# Patient Record
Sex: Female | Born: 1955 | Race: White | Hispanic: No | Marital: Married | Smoking: Former smoker
Health system: Southern US, Community
[De-identification: ages and names within clinical notes are randomized; demographics above are authoritative.]

## PROBLEM LIST (undated history)

## (undated) ENCOUNTER — Emergency Department (HOSPITAL_COMMUNITY): Payer: BC Managed Care – PPO

## (undated) DIAGNOSIS — R519 Headache, unspecified: Secondary | ICD-10-CM

## (undated) DIAGNOSIS — M1990 Unspecified osteoarthritis, unspecified site: Secondary | ICD-10-CM

## (undated) DIAGNOSIS — F419 Anxiety disorder, unspecified: Secondary | ICD-10-CM

## (undated) DIAGNOSIS — K219 Gastro-esophageal reflux disease without esophagitis: Secondary | ICD-10-CM

## (undated) DIAGNOSIS — F32A Depression, unspecified: Secondary | ICD-10-CM

## (undated) DIAGNOSIS — I10 Essential (primary) hypertension: Secondary | ICD-10-CM

## (undated) HISTORY — PX: NO PAST SURGERIES: SHX2092

## (undated) HISTORY — PX: WISDOM TOOTH EXTRACTION: SHX21

---

## 2007-03-14 ENCOUNTER — Emergency Department (HOSPITAL_COMMUNITY): Admission: EM | Admit: 2007-03-14 | Discharge: 2007-03-14 | Payer: Self-pay | Admitting: Family Medicine

## 2012-01-26 ENCOUNTER — Other Ambulatory Visit: Payer: Self-pay | Admitting: Gynecology

## 2012-01-26 DIAGNOSIS — Z1231 Encounter for screening mammogram for malignant neoplasm of breast: Secondary | ICD-10-CM

## 2012-03-07 ENCOUNTER — Ambulatory Visit
Admission: RE | Admit: 2012-03-07 | Discharge: 2012-03-07 | Disposition: A | Discharge status: Home / Self Care | Payer: BC Managed Care – PPO | Source: Ambulatory Visit | Attending: Gynecology | Admitting: Gynecology

## 2012-03-07 DIAGNOSIS — Z1231 Encounter for screening mammogram for malignant neoplasm of breast: Secondary | ICD-10-CM

## 2012-08-05 ENCOUNTER — Other Ambulatory Visit: Payer: Self-pay | Admitting: Orthopedic Surgery

## 2012-08-05 DIAGNOSIS — M549 Dorsalgia, unspecified: Secondary | ICD-10-CM

## 2012-08-12 ENCOUNTER — Other Ambulatory Visit: Payer: Self-pay | Admitting: Orthopedic Surgery

## 2012-08-12 ENCOUNTER — Ambulatory Visit
Admission: RE | Admit: 2012-08-12 | Discharge: 2012-08-12 | Disposition: A | Discharge status: Home / Self Care | Payer: BC Managed Care – PPO | Source: Ambulatory Visit | Attending: Orthopedic Surgery | Admitting: Orthopedic Surgery

## 2012-08-12 ENCOUNTER — Ambulatory Visit
Admission: RE | Admit: 2012-08-12 | Discharge: 2012-08-12 | Disposition: A | Discharge status: Home / Self Care | Payer: Self-pay | Source: Ambulatory Visit | Attending: Orthopedic Surgery | Admitting: Orthopedic Surgery

## 2012-08-12 VITALS — BP 166/87 | HR 64 | Ht 61.0 in | Wt 125.0 lb

## 2012-08-12 DIAGNOSIS — M549 Dorsalgia, unspecified: Secondary | ICD-10-CM

## 2012-08-12 MED ORDER — DIAZEPAM 5 MG PO TABS
10.0000 mg | ORAL_TABLET | Freq: Once | ORAL | Status: AC
  Administered 2012-08-12: 10 mg via ORAL

## 2012-08-12 MED ORDER — IOHEXOL 180 MG/ML  SOLN
15.0000 mL | Freq: Once | INTRAMUSCULAR | Status: AC | PRN
  Administered 2012-08-12: 15 mL via INTRATHECAL

## 2012-08-12 NOTE — Progress Notes (Addendum)
Pt has been off prozac, trazodone and adderall since Tuesday. Discharge instructions were explained to pt.

## 2014-07-10 ENCOUNTER — Ambulatory Visit
Admission: RE | Admit: 2014-07-10 | Discharge: 2014-07-10 | Disposition: A | Discharge status: Home / Self Care | Payer: 59 | Source: Ambulatory Visit | Attending: Internal Medicine | Admitting: Internal Medicine

## 2014-07-10 ENCOUNTER — Other Ambulatory Visit: Payer: Self-pay | Admitting: Internal Medicine

## 2014-07-10 DIAGNOSIS — M25552 Pain in left hip: Secondary | ICD-10-CM

## 2014-07-31 ENCOUNTER — Other Ambulatory Visit: Payer: Self-pay | Admitting: Nurse Practitioner

## 2014-07-31 ENCOUNTER — Other Ambulatory Visit (HOSPITAL_COMMUNITY)
Admission: RE | Admit: 2014-07-31 | Discharge: 2014-07-31 | Disposition: A | Discharge status: Home / Self Care | Payer: 59 | Source: Ambulatory Visit | Attending: Nurse Practitioner | Admitting: Nurse Practitioner

## 2014-07-31 DIAGNOSIS — Z1231 Encounter for screening mammogram for malignant neoplasm of breast: Secondary | ICD-10-CM

## 2014-07-31 DIAGNOSIS — Z01419 Encounter for gynecological examination (general) (routine) without abnormal findings: Secondary | ICD-10-CM | POA: Insufficient documentation

## 2014-07-31 DIAGNOSIS — Z1151 Encounter for screening for human papillomavirus (HPV): Secondary | ICD-10-CM | POA: Insufficient documentation

## 2014-08-02 LAB — CYTOLOGY - PAP

## 2014-08-07 ENCOUNTER — Ambulatory Visit
Admission: RE | Admit: 2014-08-07 | Discharge: 2014-08-07 | Disposition: A | Discharge status: Home / Self Care | Payer: 59 | Source: Ambulatory Visit | Attending: Nurse Practitioner | Admitting: Nurse Practitioner

## 2014-08-07 DIAGNOSIS — Z1231 Encounter for screening mammogram for malignant neoplasm of breast: Secondary | ICD-10-CM

## 2015-10-08 DIAGNOSIS — F3342 Major depressive disorder, recurrent, in full remission: Secondary | ICD-10-CM | POA: Diagnosis not present

## 2015-10-08 DIAGNOSIS — F900 Attention-deficit hyperactivity disorder, predominantly inattentive type: Secondary | ICD-10-CM | POA: Diagnosis not present

## 2015-10-15 DIAGNOSIS — M5441 Lumbago with sciatica, right side: Secondary | ICD-10-CM | POA: Diagnosis not present

## 2015-10-15 DIAGNOSIS — Z79891 Long term (current) use of opiate analgesic: Secondary | ICD-10-CM | POA: Diagnosis not present

## 2015-10-15 DIAGNOSIS — M5136 Other intervertebral disc degeneration, lumbar region: Secondary | ICD-10-CM | POA: Diagnosis not present

## 2016-02-10 DIAGNOSIS — H04123 Dry eye syndrome of bilateral lacrimal glands: Secondary | ICD-10-CM | POA: Diagnosis not present

## 2016-02-10 DIAGNOSIS — H40033 Anatomical narrow angle, bilateral: Secondary | ICD-10-CM | POA: Diagnosis not present

## 2016-02-25 DIAGNOSIS — G8929 Other chronic pain: Secondary | ICD-10-CM | POA: Diagnosis not present

## 2016-02-25 DIAGNOSIS — M5136 Other intervertebral disc degeneration, lumbar region: Secondary | ICD-10-CM | POA: Diagnosis not present

## 2016-02-25 DIAGNOSIS — Z79891 Long term (current) use of opiate analgesic: Secondary | ICD-10-CM | POA: Diagnosis not present

## 2016-02-25 DIAGNOSIS — M5441 Lumbago with sciatica, right side: Secondary | ICD-10-CM | POA: Diagnosis not present

## 2016-02-26 DIAGNOSIS — E559 Vitamin D deficiency, unspecified: Secondary | ICD-10-CM | POA: Diagnosis not present

## 2016-02-26 DIAGNOSIS — F17201 Nicotine dependence, unspecified, in remission: Secondary | ICD-10-CM | POA: Diagnosis not present

## 2016-02-26 DIAGNOSIS — Z79899 Other long term (current) drug therapy: Secondary | ICD-10-CM | POA: Diagnosis not present

## 2016-02-26 DIAGNOSIS — Z23 Encounter for immunization: Secondary | ICD-10-CM | POA: Diagnosis not present

## 2016-02-26 DIAGNOSIS — R682 Dry mouth, unspecified: Secondary | ICD-10-CM | POA: Diagnosis not present

## 2016-02-26 DIAGNOSIS — F329 Major depressive disorder, single episode, unspecified: Secondary | ICD-10-CM | POA: Diagnosis not present

## 2016-02-26 DIAGNOSIS — Z0001 Encounter for general adult medical examination with abnormal findings: Secondary | ICD-10-CM | POA: Diagnosis not present

## 2016-03-31 DIAGNOSIS — Z01419 Encounter for gynecological examination (general) (routine) without abnormal findings: Secondary | ICD-10-CM | POA: Diagnosis not present

## 2016-04-06 DIAGNOSIS — F3342 Major depressive disorder, recurrent, in full remission: Secondary | ICD-10-CM | POA: Diagnosis not present

## 2016-04-08 ENCOUNTER — Telehealth: Payer: Self-pay | Admitting: Acute Care

## 2016-04-08 NOTE — Telephone Encounter (Signed)
Linda Osborne ° °

## 2016-05-05 DIAGNOSIS — Z13820 Encounter for screening for osteoporosis: Secondary | ICD-10-CM | POA: Diagnosis not present

## 2016-05-05 DIAGNOSIS — Z780 Asymptomatic menopausal state: Secondary | ICD-10-CM | POA: Diagnosis not present

## 2016-05-11 ENCOUNTER — Telehealth: Payer: Self-pay | Admitting: Acute Care

## 2016-05-12 NOTE — Telephone Encounter (Signed)
error 

## 2016-05-15 ENCOUNTER — Telehealth: Payer: Self-pay | Admitting: Acute Care

## 2016-05-18 NOTE — Telephone Encounter (Signed)
LMTC x 1  

## 2016-05-20 NOTE — Telephone Encounter (Signed)
Will forward to lung cancer screening pool.  

## 2016-05-21 NOTE — Telephone Encounter (Signed)
LMTC x 1  

## 2016-05-22 ENCOUNTER — Telehealth: Payer: Self-pay | Admitting: Acute Care

## 2016-05-25 NOTE — Telephone Encounter (Signed)
Left detailed message for Raynelle FanningJulie that we have tried multiple time to contact pt to schedule SDMV.  Pt had wanted to contact [employer] to check coverage but has not called back to schedule appt.  Will await call back from pt.

## 2016-05-25 NOTE — Telephone Encounter (Signed)
LMTC x `1 for pt 

## 2016-06-01 NOTE — Telephone Encounter (Signed)
Spoke with Raynelle FanningJulie at Dr Kevan NyGates office and informed that due to multiple unsuccessful attempts to contact pt to schedule [facility] LPD[facility] referral will now be cancelled.   New referral will need to be placed if Dr Kevan NyGates would still like pt to be seen.  Letter was also sent to pt.

## 2016-06-01 NOTE — Telephone Encounter (Signed)
Due to multiple unsuccessful attempts to contact pt to schedule SDMV referral will be cancelled.  Spoke with Raynelle FanningJulie at Dr Kevan NyGates office and made her aware that if Dr Kevan NyGates would still like pt to be seen a new referral will need to be placed.  Letter sent to pt to advise her that referral has been cancelled.

## 2016-06-18 DIAGNOSIS — M8588 Other specified disorders of bone density and structure, other site: Secondary | ICD-10-CM | POA: Diagnosis not present

## 2016-06-29 DIAGNOSIS — G8929 Other chronic pain: Secondary | ICD-10-CM | POA: Diagnosis not present

## 2016-06-29 DIAGNOSIS — M5441 Lumbago with sciatica, right side: Secondary | ICD-10-CM | POA: Diagnosis not present

## 2016-08-26 DIAGNOSIS — H52222 Regular astigmatism, left eye: Secondary | ICD-10-CM | POA: Diagnosis not present

## 2016-08-26 DIAGNOSIS — H25013 Cortical age-related cataract, bilateral: Secondary | ICD-10-CM | POA: Diagnosis not present

## 2016-08-26 DIAGNOSIS — H5211 Myopia, right eye: Secondary | ICD-10-CM | POA: Diagnosis not present

## 2016-08-26 DIAGNOSIS — H524 Presbyopia: Secondary | ICD-10-CM | POA: Diagnosis not present

## 2016-08-26 DIAGNOSIS — H2513 Age-related nuclear cataract, bilateral: Secondary | ICD-10-CM | POA: Diagnosis not present

## 2016-09-02 DIAGNOSIS — H25013 Cortical age-related cataract, bilateral: Secondary | ICD-10-CM | POA: Diagnosis not present

## 2016-09-02 DIAGNOSIS — H2513 Age-related nuclear cataract, bilateral: Secondary | ICD-10-CM | POA: Diagnosis not present

## 2016-09-22 DIAGNOSIS — F3342 Major depressive disorder, recurrent, in full remission: Secondary | ICD-10-CM | POA: Diagnosis not present

## 2016-09-22 DIAGNOSIS — F900 Attention-deficit hyperactivity disorder, predominantly inattentive type: Secondary | ICD-10-CM | POA: Diagnosis not present

## 2016-09-23 DIAGNOSIS — H2512 Age-related nuclear cataract, left eye: Secondary | ICD-10-CM | POA: Diagnosis not present

## 2016-09-23 DIAGNOSIS — H25012 Cortical age-related cataract, left eye: Secondary | ICD-10-CM | POA: Diagnosis not present

## 2016-09-23 DIAGNOSIS — H25011 Cortical age-related cataract, right eye: Secondary | ICD-10-CM | POA: Diagnosis not present

## 2016-09-23 DIAGNOSIS — H2511 Age-related nuclear cataract, right eye: Secondary | ICD-10-CM | POA: Diagnosis not present

## 2016-09-30 DIAGNOSIS — H25012 Cortical age-related cataract, left eye: Secondary | ICD-10-CM | POA: Diagnosis not present

## 2016-09-30 DIAGNOSIS — H2512 Age-related nuclear cataract, left eye: Secondary | ICD-10-CM | POA: Diagnosis not present

## 2016-10-27 DIAGNOSIS — M5441 Lumbago with sciatica, right side: Secondary | ICD-10-CM | POA: Diagnosis not present

## 2016-10-27 DIAGNOSIS — G8929 Other chronic pain: Secondary | ICD-10-CM | POA: Diagnosis not present

## 2017-03-22 DIAGNOSIS — F3342 Major depressive disorder, recurrent, in full remission: Secondary | ICD-10-CM | POA: Diagnosis not present

## 2017-03-22 DIAGNOSIS — F900 Attention-deficit hyperactivity disorder, predominantly inattentive type: Secondary | ICD-10-CM | POA: Diagnosis not present

## 2017-03-28 DIAGNOSIS — K0889 Other specified disorders of teeth and supporting structures: Secondary | ICD-10-CM | POA: Diagnosis not present

## 2017-03-28 DIAGNOSIS — R030 Elevated blood-pressure reading, without diagnosis of hypertension: Secondary | ICD-10-CM | POA: Diagnosis not present

## 2017-04-23 DIAGNOSIS — G894 Chronic pain syndrome: Secondary | ICD-10-CM | POA: Diagnosis not present

## 2017-07-21 DIAGNOSIS — G894 Chronic pain syndrome: Secondary | ICD-10-CM | POA: Diagnosis not present

## 2017-07-21 DIAGNOSIS — Z79891 Long term (current) use of opiate analgesic: Secondary | ICD-10-CM | POA: Diagnosis not present

## 2017-07-27 DIAGNOSIS — Z79891 Long term (current) use of opiate analgesic: Secondary | ICD-10-CM | POA: Diagnosis not present

## 2017-09-13 DIAGNOSIS — F3342 Major depressive disorder, recurrent, in full remission: Secondary | ICD-10-CM | POA: Diagnosis not present

## 2017-09-13 DIAGNOSIS — F900 Attention-deficit hyperactivity disorder, predominantly inattentive type: Secondary | ICD-10-CM | POA: Diagnosis not present

## 2017-10-04 ENCOUNTER — Other Ambulatory Visit: Payer: Self-pay | Admitting: Internal Medicine

## 2017-10-04 ENCOUNTER — Ambulatory Visit
Admission: RE | Admit: 2017-10-04 | Discharge: 2017-10-04 | Disposition: A | Discharge status: Home / Self Care | Payer: BLUE CROSS/BLUE SHIELD | Source: Ambulatory Visit | Attending: Internal Medicine | Admitting: Internal Medicine

## 2017-10-04 DIAGNOSIS — F17201 Nicotine dependence, unspecified, in remission: Secondary | ICD-10-CM | POA: Diagnosis not present

## 2017-10-04 DIAGNOSIS — R05 Cough: Secondary | ICD-10-CM

## 2017-10-04 DIAGNOSIS — R5383 Other fatigue: Secondary | ICD-10-CM | POA: Diagnosis not present

## 2017-10-04 DIAGNOSIS — E559 Vitamin D deficiency, unspecified: Secondary | ICD-10-CM | POA: Diagnosis not present

## 2017-10-04 DIAGNOSIS — R059 Cough, unspecified: Secondary | ICD-10-CM

## 2017-10-04 DIAGNOSIS — Z79899 Other long term (current) drug therapy: Secondary | ICD-10-CM | POA: Diagnosis not present

## 2017-10-07 ENCOUNTER — Other Ambulatory Visit: Payer: Self-pay | Admitting: Internal Medicine

## 2017-10-07 DIAGNOSIS — R222 Localized swelling, mass and lump, trunk: Secondary | ICD-10-CM

## 2017-10-11 ENCOUNTER — Ambulatory Visit
Admission: RE | Admit: 2017-10-11 | Discharge: 2017-10-11 | Disposition: A | Discharge status: Home / Self Care | Payer: BLUE CROSS/BLUE SHIELD | Source: Ambulatory Visit | Attending: Internal Medicine | Admitting: Internal Medicine

## 2017-10-11 ENCOUNTER — Encounter: Payer: Self-pay | Admitting: Radiology

## 2017-10-11 DIAGNOSIS — J439 Emphysema, unspecified: Secondary | ICD-10-CM | POA: Diagnosis not present

## 2017-10-11 DIAGNOSIS — R222 Localized swelling, mass and lump, trunk: Secondary | ICD-10-CM

## 2017-10-11 DIAGNOSIS — R918 Other nonspecific abnormal finding of lung field: Secondary | ICD-10-CM | POA: Diagnosis not present

## 2017-10-11 MED ORDER — IOPAMIDOL (ISOVUE-300) INJECTION 61%
75.0000 mL | Freq: Once | INTRAVENOUS | Status: AC | PRN
  Administered 2017-10-11: 75 mL via INTRAVENOUS

## 2017-10-14 ENCOUNTER — Other Ambulatory Visit: Payer: Self-pay | Admitting: Internal Medicine

## 2017-10-14 DIAGNOSIS — R918 Other nonspecific abnormal finding of lung field: Secondary | ICD-10-CM

## 2017-11-16 ENCOUNTER — Other Ambulatory Visit (HOSPITAL_COMMUNITY): Payer: Self-pay | Admitting: Respiratory Therapy

## 2017-11-16 DIAGNOSIS — F17201 Nicotine dependence, unspecified, in remission: Secondary | ICD-10-CM

## 2017-11-16 DIAGNOSIS — Z23 Encounter for immunization: Secondary | ICD-10-CM | POA: Diagnosis not present

## 2017-11-16 DIAGNOSIS — R05 Cough: Secondary | ICD-10-CM | POA: Diagnosis not present

## 2017-11-29 DIAGNOSIS — Z79891 Long term (current) use of opiate analgesic: Secondary | ICD-10-CM | POA: Diagnosis not present

## 2017-12-02 ENCOUNTER — Ambulatory Visit (HOSPITAL_COMMUNITY)
Admission: RE | Admit: 2017-12-02 | Discharge: 2017-12-02 | Disposition: A | Discharge status: Home / Self Care | Payer: BLUE CROSS/BLUE SHIELD | Source: Ambulatory Visit | Attending: Internal Medicine | Admitting: Internal Medicine

## 2017-12-02 DIAGNOSIS — F17201 Nicotine dependence, unspecified, in remission: Secondary | ICD-10-CM | POA: Diagnosis not present

## 2017-12-02 LAB — PULMONARY FUNCTION TEST
DL/VA % pred: 90 %
DL/VA: 3.98 ml/min/mmHg/L
DLCO UNC % PRED: 81 %
DLCO unc: 16.52 ml/min/mmHg
FEF 25-75 PRE: 2.34 L/s
FEF 25-75 Post: 1.31 L/sec
FEF2575-%Change-Post: -44 %
FEF2575-%Pred-Post: 61 %
FEF2575-%Pred-Pre: 109 %
FEV1-%Change-Post: -9 %
FEV1-%PRED-POST: 83 %
FEV1-%Pred-Pre: 92 %
FEV1-PRE: 2.08 L
FEV1-Post: 1.87 L
FEV1FVC-%CHANGE-POST: -15 %
FEV1FVC-%PRED-PRE: 102 %
FEV6-%CHANGE-POST: 15 %
FEV6-%Pred-Post: 94 %
FEV6-%Pred-Pre: 81 %
FEV6-Post: 2.65 L
FEV6-Pre: 2.29 L
FEV6FVC-%Change-Post: -1 %
FEV6FVC-%PRED-PRE: 104 %
FEV6FVC-%Pred-Post: 103 %
FVC-%Change-Post: 6 %
FVC-%Pred-Post: 94 %
FVC-%Pred-Pre: 88 %
FVC-Post: 2.76 L
POST FEV1/FVC RATIO: 68 %
PRE FEV1/FVC RATIO: 80 %
PRE FEV6/FVC RATIO: 100 %
Post FEV6/FVC ratio: 99 %
RV % PRED: 221 %
RV: 4.12 L
TLC % pred: 157 %
TLC: 7.25 L

## 2017-12-02 MED ORDER — ALBUTEROL SULFATE (2.5 MG/3ML) 0.083% IN NEBU
2.5000 mg | INHALATION_SOLUTION | Freq: Once | RESPIRATORY_TRACT | Status: AC
  Administered 2017-12-02: 2.5 mg via RESPIRATORY_TRACT

## 2018-03-14 DIAGNOSIS — F411 Generalized anxiety disorder: Secondary | ICD-10-CM | POA: Diagnosis not present

## 2018-03-14 DIAGNOSIS — F900 Attention-deficit hyperactivity disorder, predominantly inattentive type: Secondary | ICD-10-CM | POA: Diagnosis not present

## 2018-03-14 DIAGNOSIS — F3342 Major depressive disorder, recurrent, in full remission: Secondary | ICD-10-CM | POA: Diagnosis not present

## 2018-03-31 DIAGNOSIS — G894 Chronic pain syndrome: Secondary | ICD-10-CM | POA: Diagnosis not present

## 2018-04-13 ENCOUNTER — Ambulatory Visit
Admission: RE | Admit: 2018-04-13 | Discharge: 2018-04-13 | Disposition: A | Discharge status: Home / Self Care | Payer: BLUE CROSS/BLUE SHIELD | Source: Ambulatory Visit | Attending: Internal Medicine | Admitting: Internal Medicine

## 2018-04-13 DIAGNOSIS — R918 Other nonspecific abnormal finding of lung field: Secondary | ICD-10-CM

## 2018-04-13 MED ORDER — IOPAMIDOL (ISOVUE-300) INJECTION 61%
75.0000 mL | Freq: Once | INTRAVENOUS | Status: AC | PRN
  Administered 2018-04-13: 75 mL via INTRAVENOUS

## 2018-09-08 DIAGNOSIS — Z79899 Other long term (current) drug therapy: Secondary | ICD-10-CM | POA: Diagnosis not present

## 2018-09-12 DIAGNOSIS — F3342 Major depressive disorder, recurrent, in full remission: Secondary | ICD-10-CM | POA: Diagnosis not present

## 2018-09-12 DIAGNOSIS — F410 Panic disorder [episodic paroxysmal anxiety] without agoraphobia: Secondary | ICD-10-CM | POA: Diagnosis not present

## 2018-09-12 DIAGNOSIS — F900 Attention-deficit hyperactivity disorder, predominantly inattentive type: Secondary | ICD-10-CM | POA: Diagnosis not present

## 2018-12-12 DIAGNOSIS — I10 Essential (primary) hypertension: Secondary | ICD-10-CM | POA: Diagnosis not present

## 2018-12-12 DIAGNOSIS — Z23 Encounter for immunization: Secondary | ICD-10-CM | POA: Diagnosis not present

## 2018-12-12 DIAGNOSIS — Z79899 Other long term (current) drug therapy: Secondary | ICD-10-CM | POA: Diagnosis not present

## 2018-12-12 DIAGNOSIS — F329 Major depressive disorder, single episode, unspecified: Secondary | ICD-10-CM | POA: Diagnosis not present

## 2018-12-12 DIAGNOSIS — E559 Vitamin D deficiency, unspecified: Secondary | ICD-10-CM | POA: Diagnosis not present

## 2018-12-12 DIAGNOSIS — R41840 Attention and concentration deficit: Secondary | ICD-10-CM | POA: Diagnosis not present

## 2018-12-12 DIAGNOSIS — R946 Abnormal results of thyroid function studies: Secondary | ICD-10-CM | POA: Diagnosis not present

## 2018-12-12 DIAGNOSIS — R918 Other nonspecific abnormal finding of lung field: Secondary | ICD-10-CM | POA: Diagnosis not present

## 2018-12-12 DIAGNOSIS — F17201 Nicotine dependence, unspecified, in remission: Secondary | ICD-10-CM | POA: Diagnosis not present

## 2018-12-12 DIAGNOSIS — Z0000 Encounter for general adult medical examination without abnormal findings: Secondary | ICD-10-CM | POA: Diagnosis not present

## 2019-01-02 ENCOUNTER — Other Ambulatory Visit (HOSPITAL_COMMUNITY)
Admission: RE | Admit: 2019-01-02 | Discharge: 2019-01-02 | Disposition: A | Discharge status: Home / Self Care | Payer: BC Managed Care – PPO | Source: Ambulatory Visit | Attending: Nurse Practitioner | Admitting: Nurse Practitioner

## 2019-01-02 ENCOUNTER — Other Ambulatory Visit: Payer: Self-pay | Admitting: Nurse Practitioner

## 2019-01-02 DIAGNOSIS — Z124 Encounter for screening for malignant neoplasm of cervix: Secondary | ICD-10-CM | POA: Insufficient documentation

## 2019-01-02 DIAGNOSIS — Z01419 Encounter for gynecological examination (general) (routine) without abnormal findings: Secondary | ICD-10-CM | POA: Diagnosis not present

## 2019-01-04 LAB — CYTOLOGY - PAP
Comment: NEGATIVE
Diagnosis: NEGATIVE
High risk HPV: NEGATIVE

## 2019-01-09 DIAGNOSIS — M545 Low back pain: Secondary | ICD-10-CM | POA: Diagnosis not present

## 2019-01-09 DIAGNOSIS — G894 Chronic pain syndrome: Secondary | ICD-10-CM | POA: Diagnosis not present

## 2019-01-13 DIAGNOSIS — R946 Abnormal results of thyroid function studies: Secondary | ICD-10-CM | POA: Diagnosis not present

## 2019-01-23 ENCOUNTER — Other Ambulatory Visit: Payer: Self-pay | Admitting: Nurse Practitioner

## 2019-01-23 DIAGNOSIS — M8580 Other specified disorders of bone density and structure, unspecified site: Secondary | ICD-10-CM

## 2019-02-20 ENCOUNTER — Other Ambulatory Visit: Payer: Self-pay | Admitting: Internal Medicine

## 2019-02-20 DIAGNOSIS — Z1231 Encounter for screening mammogram for malignant neoplasm of breast: Secondary | ICD-10-CM

## 2019-04-01 DIAGNOSIS — F900 Attention-deficit hyperactivity disorder, predominantly inattentive type: Secondary | ICD-10-CM | POA: Diagnosis not present

## 2019-04-01 DIAGNOSIS — F3342 Major depressive disorder, recurrent, in full remission: Secondary | ICD-10-CM | POA: Diagnosis not present

## 2019-04-17 ENCOUNTER — Ambulatory Visit
Admission: RE | Admit: 2019-04-17 | Discharge: 2019-04-17 | Disposition: A | Discharge status: Home / Self Care | Payer: BC Managed Care – PPO | Source: Ambulatory Visit | Attending: Internal Medicine | Admitting: Internal Medicine

## 2019-04-17 ENCOUNTER — Other Ambulatory Visit: Payer: Self-pay

## 2019-04-17 DIAGNOSIS — Z1231 Encounter for screening mammogram for malignant neoplasm of breast: Secondary | ICD-10-CM | POA: Diagnosis not present

## 2019-05-08 DIAGNOSIS — R52 Pain, unspecified: Secondary | ICD-10-CM | POA: Diagnosis not present

## 2019-05-14 IMAGING — CT CT CHEST W/ CM
1 series · 15 of 34 positions shown, 19 images · IV contrast (iopamidol)
Comparison: Chest x-ray dated October 04, 2017.

CLINICAL DATA: Shortness of breath. Multiple lung nodules on chest
x-ray.

EXAM:
CT CHEST WITH CONTRAST
TECHNIQUE: Multidetector CT imaging of the chest was performed during
intravenous contrast administration.
CONTRAST:  75mL VMDAKP-B77 IOPAMIDOL (VMDAKP-B77) INJECTION 61%

[Series 2: chest w/cm · axial · 0.72mm/px · z∈[-327,-41]mm · 15 of 169 slices shown, 19 images]
[im 13/169  mediastinal]
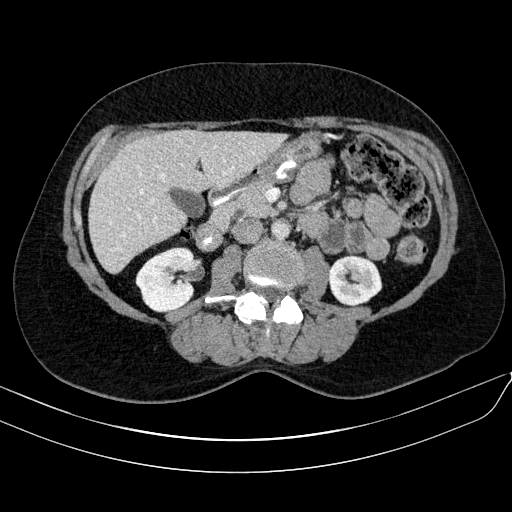
[im 13/169  lung]
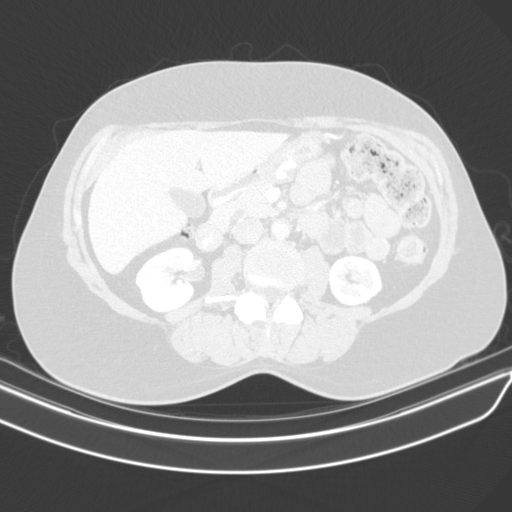
[im 25/169  lung]
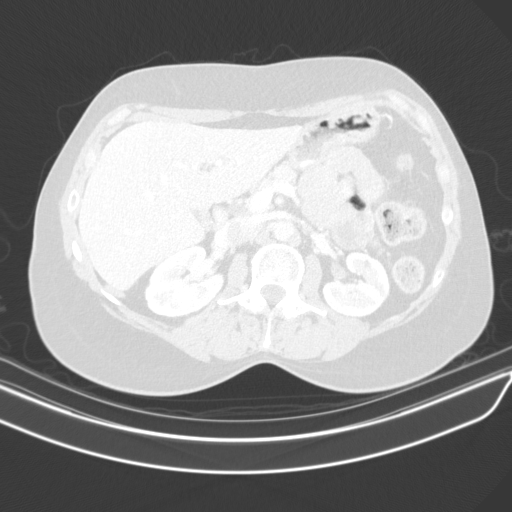
[im 34/169  lung]
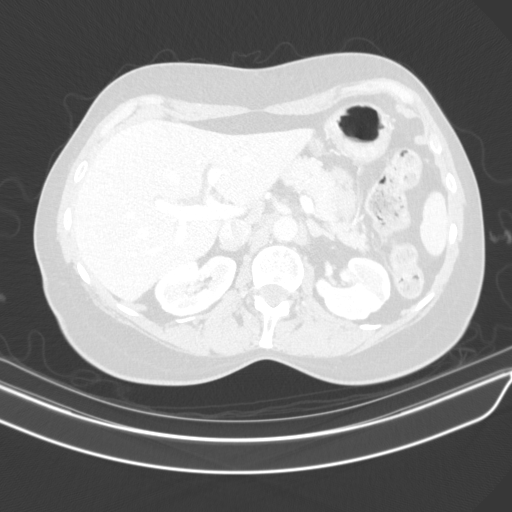
[im 44/169  lung]
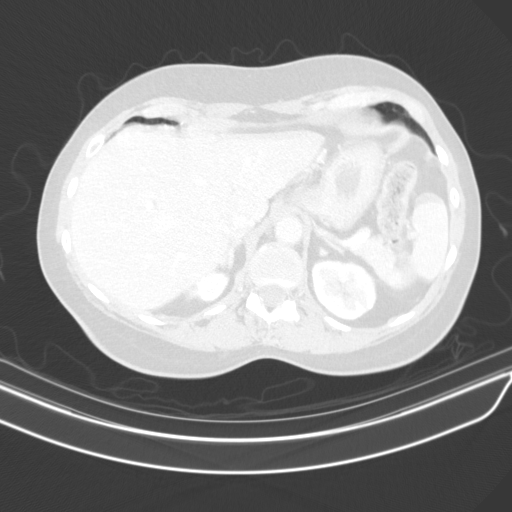
[im 57/169  mediastinal]
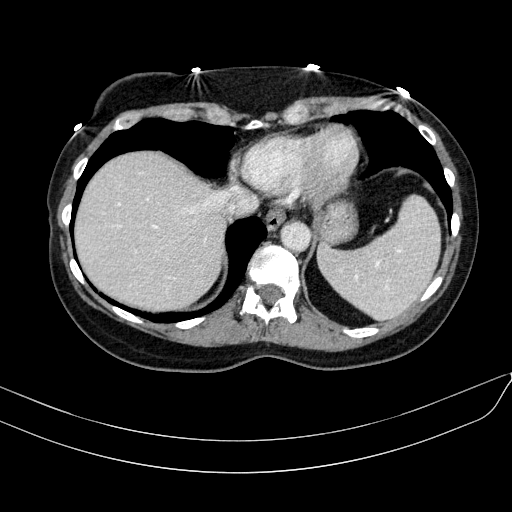
[im 57/169  lung]
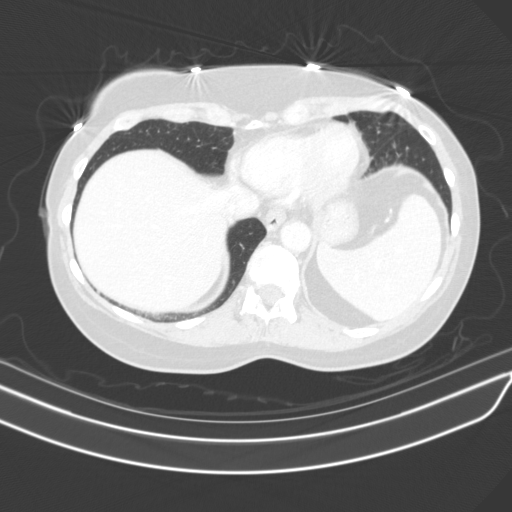
[im 68/169  lung]
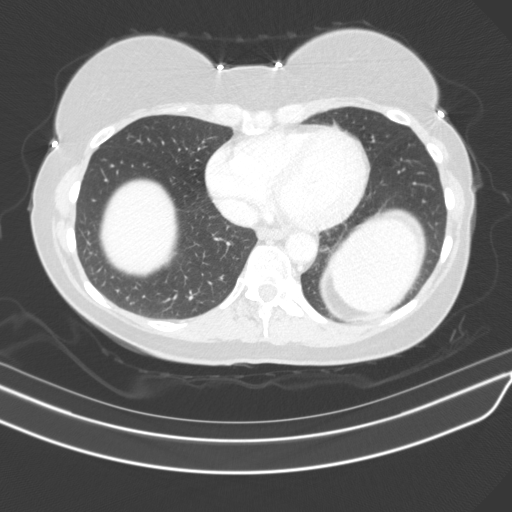
[im 75/169  lung]
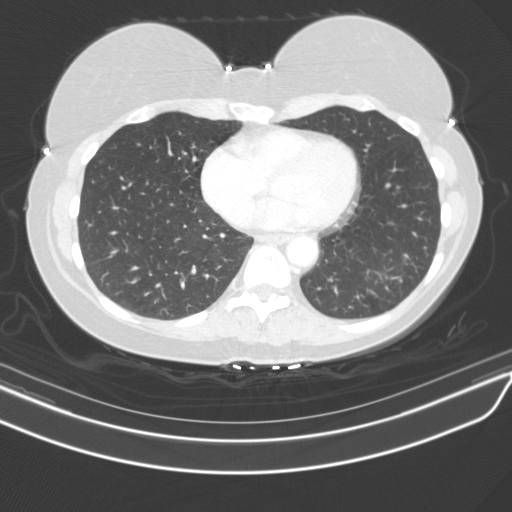
[im 88/169  lung]
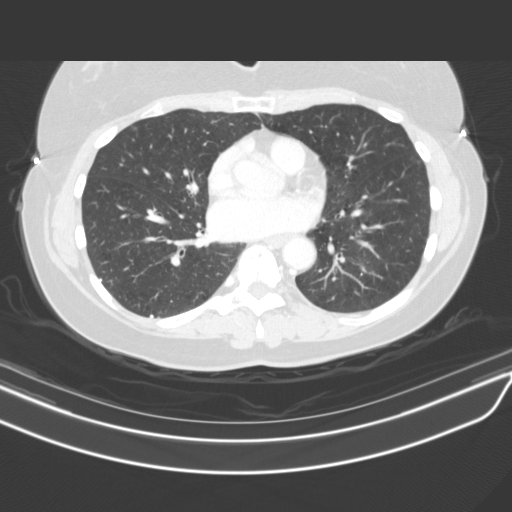
[im 94/169  mediastinal]
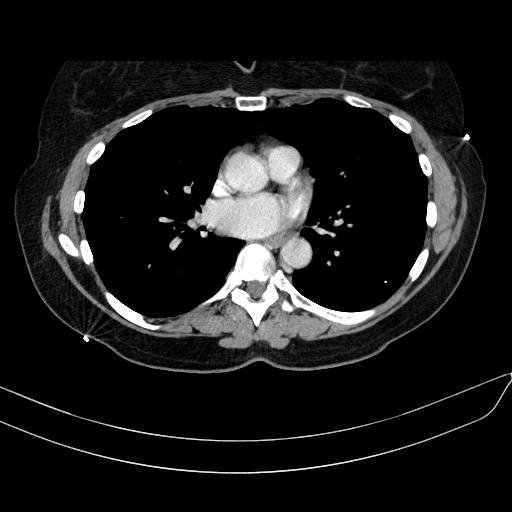
[im 94/169  lung]
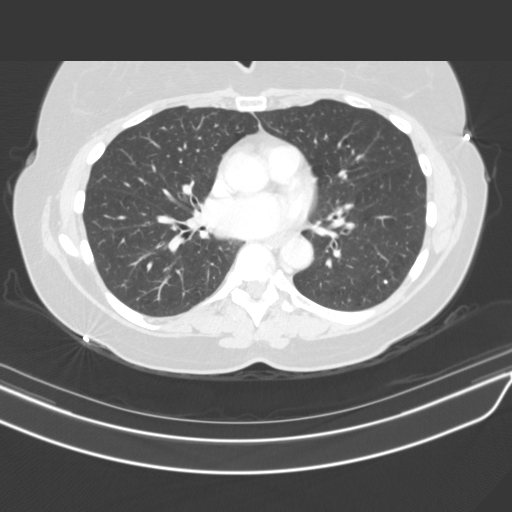
[im 101/169  lung]
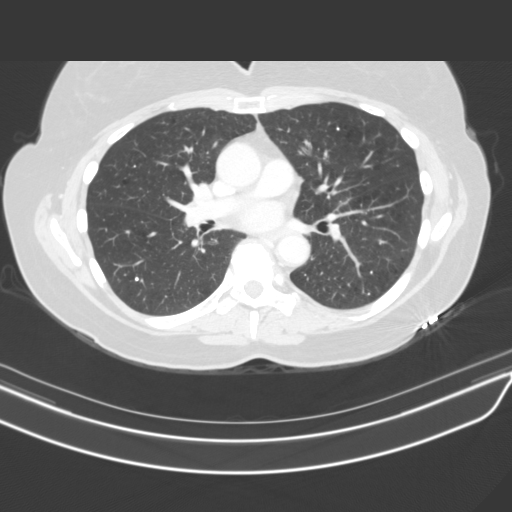
[im 113/169  lung]
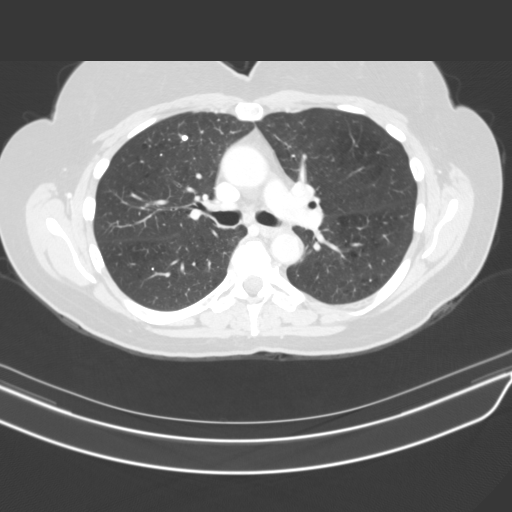
[im 125/169  lung]
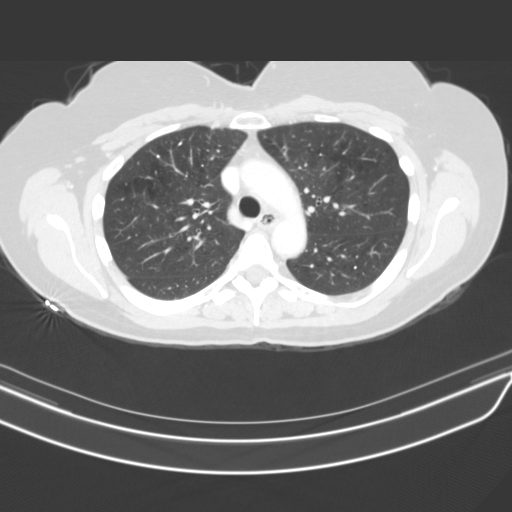
[im 135/169  mediastinal]
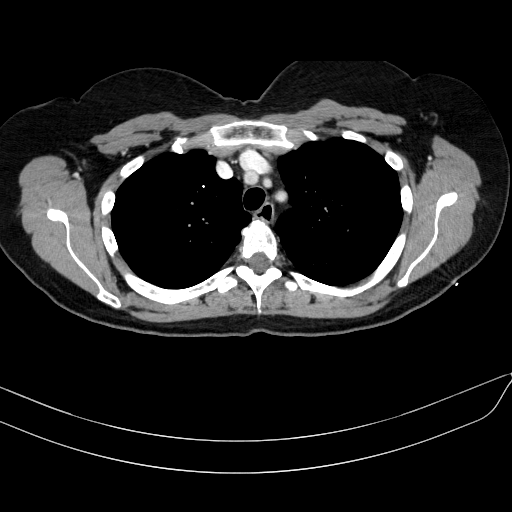
[im 135/169  lung]
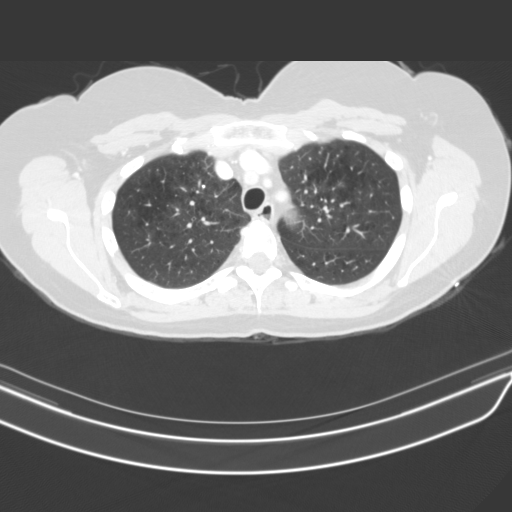
[im 144/169  lung]
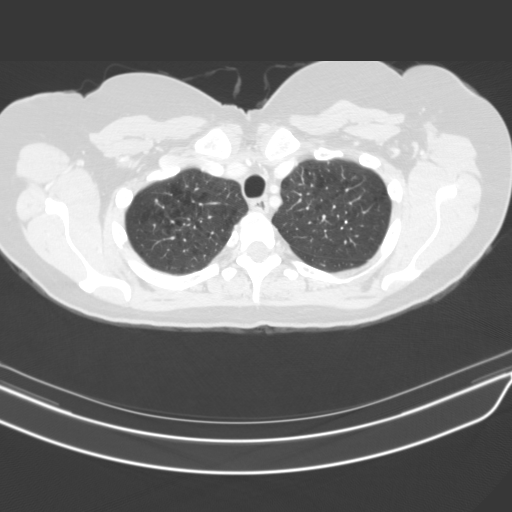
[im 156/169  lung]
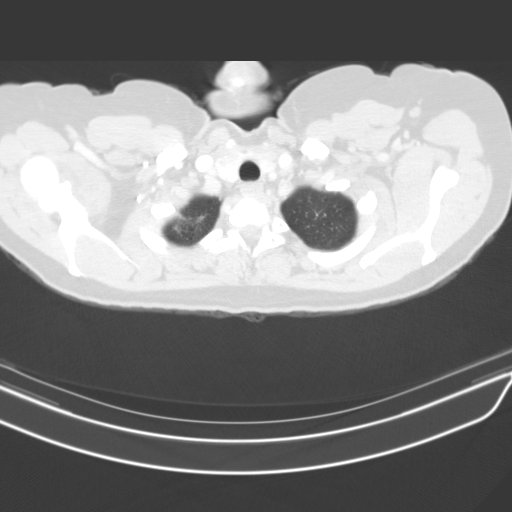

[15 of 34 positions shown; findings below may reference images not displayed]

FINDINGS: Cardiovascular: Normal heart size. No pericardial effusion. Normal
caliber thoracic aorta. Mild coronary, aortic arch, and branch
vessel atherosclerotic vascular disease. No central pulmonary
embolism.

Mediastinum/Nodes: No enlarged mediastinal, hilar, or axillary lymph
nodes. Multiple small hypodense nodules in both thyroid lobes
measuring up to 1 cm. The trachea and esophagus demonstrate no
significant findings.

Lungs/Pleura: There are multiple small diffusely calcified nodules
throughout both lungs. 7 x 4 mm pulmonary nodule in the right lung
apex (series 3, image 17). There is a partially rim calcified 6 x 5
mm pulmonary nodule in the left lower lobe (series 3, image 51).
Mild centrilobular emphysema. No focal consolidation, pleural
effusion, or pneumothorax.

Upper Abdomen: No acute abnormality.

Musculoskeletal: No chest wall abnormality. No acute or significant
osseous findings. Moderate degenerative changes of the midthoracic
spine.
IMPRESSION: 1. 6 mm aggregate pulmonary nodules in the right upper and left
lower lobes. Non-contrast chest CT at 3-6 months is recommended. If
the nodules are stable at time of repeat CT, then future CT at 18-24
months (from today's scan) is considered optional for low-risk
patients, but is recommended for high-risk patients. This
recommendation follows the consensus statement: Guidelines for
Management of Incidental Pulmonary Nodules Detected on CT Images:
2. Multiple small diffusely calcified nodules throughout both lungs,
consistent with prior granulomatous disease.
3.  Emphysema (9TTK0-OAD.Q).
4.  Aortic atherosclerosis (9TTK0-WIZ.Z).

## 2019-08-21 ENCOUNTER — Emergency Department (HOSPITAL_COMMUNITY)
Admission: EM | Admit: 2019-08-21 | Discharge: 2019-08-22 | Disposition: A | Discharge status: Home / Self Care | Payer: BC Managed Care – PPO | Attending: Emergency Medicine | Admitting: Emergency Medicine

## 2019-08-21 ENCOUNTER — Other Ambulatory Visit: Payer: Self-pay

## 2019-08-21 ENCOUNTER — Encounter (HOSPITAL_COMMUNITY): Payer: Self-pay | Admitting: Emergency Medicine

## 2019-08-21 ENCOUNTER — Ambulatory Visit
Admission: EM | Admit: 2019-08-21 | Discharge: 2019-08-21 | Disposition: A | Discharge status: Home / Self Care | Payer: BC Managed Care – PPO | Source: Home / Self Care

## 2019-08-21 DIAGNOSIS — R55 Syncope and collapse: Secondary | ICD-10-CM | POA: Diagnosis not present

## 2019-08-21 DIAGNOSIS — Z5321 Procedure and treatment not carried out due to patient leaving prior to being seen by health care provider: Secondary | ICD-10-CM | POA: Diagnosis not present

## 2019-08-21 LAB — URINALYSIS, ROUTINE W REFLEX MICROSCOPIC
Bilirubin Urine: NEGATIVE
Glucose, UA: NEGATIVE mg/dL
Hgb urine dipstick: NEGATIVE
Ketones, ur: NEGATIVE mg/dL
Nitrite: NEGATIVE
Protein, ur: NEGATIVE mg/dL
Specific Gravity, Urine: 1.019 (ref 1.005–1.030)
pH: 6 (ref 5.0–8.0)

## 2019-08-21 LAB — CBC
HCT: 39.6 % (ref 36.0–46.0)
Hemoglobin: 12.9 g/dL (ref 12.0–15.0)
MCH: 30.1 pg (ref 26.0–34.0)
MCHC: 32.6 g/dL (ref 30.0–36.0)
MCV: 92.5 fL (ref 80.0–100.0)
Platelets: 365 10*3/uL (ref 150–400)
RBC: 4.28 MIL/uL (ref 3.87–5.11)
RDW: 13.2 % (ref 11.5–15.5)
WBC: 10 10*3/uL (ref 4.0–10.5)
nRBC: 0 % (ref 0.0–0.2)

## 2019-08-21 LAB — BASIC METABOLIC PANEL
Anion gap: 7 (ref 5–15)
BUN: 12 mg/dL (ref 8–23)
CO2: 27 mmol/L (ref 22–32)
Calcium: 10.5 mg/dL — ABNORMAL HIGH (ref 8.9–10.3)
Chloride: 106 mmol/L (ref 98–111)
Creatinine, Ser: 0.84 mg/dL (ref 0.44–1.00)
GFR calc Af Amer: >60 mL/min (ref >60)
GFR calc non Af Amer: >60 mL/min (ref >60)
Glucose, Bld: 102 mg/dL — ABNORMAL HIGH (ref 70–99)
Potassium: 4 mmol/L (ref 3.5–5.1)
Sodium: 140 mmol/L (ref 135–145)

## 2019-08-21 MED ORDER — SODIUM CHLORIDE 0.9% FLUSH: 3.0000 mL | Freq: Once | INTRAVENOUS | Status: DC

## 2019-08-21 NOTE — ED Notes (Signed)
Patient is being discharged from the Urgent Care and sent to the Emergency Department via POV with husband as driver . Per [place] [facility], [place], patient is in need of higher level of care due to syncopal episode. Patient is aware and verbalizes understanding of plan of care.  Vitals:   08/21/19 1959  BP: (!) 151/89  Pulse: 83  Resp: 18  Temp: 98 F (36.7 C)  SpO2: 98%

## 2019-08-21 NOTE — ED Triage Notes (Signed)
Pt sent by UC for further evaluation of syncopal episode. Pt reports she was in the kitchen cooking, and then her husband found her on the floor. Pt does not recall how this happened, states she has been feeling her normal today. A&O x 4 at this time.

## 2019-08-21 NOTE — ED Triage Notes (Signed)
Pt presents to UC for possible syncopal episode. Pt husband states he found patient laying in floor at home, sitting against the kitchen cabinet. Pt has no memory about how she arrived to that position. Pt AAO at this time, but cannot recall even whatsoever. Pt instructed to go to ER with husband driving, verbal understanding of both.

## 2019-08-22 DIAGNOSIS — R55 Syncope and collapse: Secondary | ICD-10-CM | POA: Diagnosis not present

## 2019-08-30 ENCOUNTER — Encounter: Payer: Self-pay | Admitting: *Deleted

## 2019-08-30 ENCOUNTER — Ambulatory Visit: Payer: BC Managed Care – PPO | Admitting: Neurology

## 2019-08-30 ENCOUNTER — Encounter: Payer: Self-pay | Admitting: Neurology

## 2019-08-30 VITALS — BP 187/100 | HR 79 | Ht 61.0 in | Wt 134.0 lb

## 2019-08-30 DIAGNOSIS — R55 Syncope and collapse: Secondary | ICD-10-CM | POA: Insufficient documentation

## 2019-08-30 DIAGNOSIS — R4020 Unspecified coma: Secondary | ICD-10-CM | POA: Diagnosis not present

## 2019-08-30 NOTE — Progress Notes (Signed)
[facility]  PATIENT: Linda Osborne DOB: 63-May-1957  REFERRING DOCTOR OR PCP: Marden Noble, MD SOURCE: Patient, notes from PCP, emergency room records, EKG and lab  _________________________________   HISTORICAL  CHIEF COMPLAINT:  Chief Complaint  Patient presents with  . New Patient (Initial Visit)    RM 12 with husband. Paper referral from [facility], [place] for syncope/collapse. She was cooking eggs in the kitchen and felt light headed and slid herself down the cabinet and does not remember anything after. Husband found he sitting against the cabinet unconcious. He had to wake her up and she was very groggy, unsure what was going on. EKG/labs negative. Has had no recent CT/MRI brain.     HISTORY OF PRESENT ILLNESS:  I had the pleasure seeing your patient, Linda Osborne, at [facility] for neurologic consultation regarding her recent episode of loss of consciousness.  On 08/21/19, she was making hard boiled eggs and recalls looking down when she dropped an egg.   Next thing she recalls is v\being on the floor.   Her husband returned (could have been as long as 4-5 hours later if the event was at lunch) and she was half sitting against the [place] in the kitchen when he enterred.   She was initially asleep and then groggy. She needed help to get tot he bed.  She rested a few hours but was still very tired and confused so they want to the ED.  After several hours she had not been seen and they left.   She saw primary care a day or two later and was referred for further evaluation.      She has never had a similar event in the past.    There was no evidence of head trauma (blood, knot,laceration).     She remained groggy the day of the event but was better later that night.     Right now she is slightly tired but essentially back to baseline.      She is on oxycodone for chronic back pain and Adderall for ADD .  She is also on Wellbutrin and  fluoxetine for mood,  EKG and labs were normal or noncontributary.      REVIEW OF SYSTEMS: Constitutional: No fevers, chills, sweats, or change in appetite.  She has ADD. Eyes: No visual changes, double vision, eye pain Ear, nose and throat: No hearing loss, ear pain, nasal congestion, sore throat Cardiovascular: No chest pain, palpitations Respiratory: No shortness of breath at rest or with exertion.   No wheezes GastrointestinaI: No nausea, vomiting, diarrhea, abdominal pain, fecal incontinence Genitourinary: No dysuria, urinary retention or frequency.  No nocturia. Musculoskeletal:She has chronic back pain. Integumentary: No rash, pruritus, skin lesions Neurological: as above Psychiatric: She has had depression and anxiety. Endocrine: No palpitations, diaphoresis, change in appetite, change in weigh or increased thirst Hematologic/Lymphatic: No anemia, purpura, petechiae. Allergic/Immunologic: No itchy/runny eyes, nasal congestion, recent allergic reactions, rashes  ALLERGIES: No Known Allergies  HOME MEDICATIONS:  Current Outpatient Medications:  .  amphetamine-dextroamphetamine (ADDERALL) 20 MG tablet, Take 20 mg by mouth daily., Disp: , Rfl:  .  amphetamine-dextroamphetamine (ADDERALL) 30 MG tablet, Take 1 tablet by mouth. 1 am, 1 at noon, Disp: , Rfl:  .  buPROPion (WELLBUTRIN XL) 300 MG 24 hr tablet, Take 300 mg by mouth every morning., Disp: , Rfl:  .  FLUoxetine (PROZAC) 20 MG capsule, Take 60 mg by mouth daily., Disp: , Rfl:  .  Oxycodone  HCl 10 MG TABS, Take 10 mg by mouth 5 (five) times daily as needed., Disp: , Rfl:  .  traZODone (DESYREL) 50 MG tablet, Take 1-3 tablets by mouth at bedtime., Disp: , Rfl:   PAST MEDICAL HISTORY: History reviewed. No pertinent past medical history.  PAST SURGICAL HISTORY: Past Surgical History:  Procedure Laterality Date  . NO PAST SURGERIES      FAMILY HISTORY: Family History  Problem Relation Age of Onset  . Breast  cancer Neg Hx     SOCIAL HISTORY:  Social History   Socioeconomic History  . Marital status: Married    Spouse name: Not on file  . Number of children: Not on file  . Years of education: Not on file  . Highest education level: Not on file  Occupational History  . Not on file  Tobacco Use  . Smoking status: Former Smoker    Packs/day: 0.50    Years: 20.00    Pack years: 10.00    Types: Cigarettes    Quit date: 08/13/2010    Years since quitting: 9.0  . Smokeless tobacco: Never Used  Substance and Sexual Activity  . Alcohol use: Not Currently  . Drug use: Never  . Sexual activity: Not on file  Other Topics Concern  . Not on file  Social History Narrative   Right handed    Lives with husband   Caffeine use: 1 cup coffee per day   Sweet tea sometimes   Social Determinants of Health   Financial Resource Strain:   . Difficulty of Paying Living Expenses:   Food Insecurity:   . Worried About Charity fundraiser in the Last Year:   . Arboriculturist in the Last Year:   Transportation Needs:   . Film/video editor (Medical):   Marland Kitchen Lack of Transportation (Non-Medical):   Physical Activity:   . Days of Exercise per Week:   . Minutes of Exercise per Session:   Stress:   . Feeling of Stress :   Social Connections:   . Frequency of Communication with Friends and Family:   . Frequency of Social Gatherings with Friends and Family:   . Attends Religious Services:   . Active Member of Clubs or Organizations:   . Attends Archivist Meetings:   Marland Kitchen Marital Status:   Intimate Partner Violence:   . Fear of Current or Ex-Partner:   . Emotionally Abused:   Marland Kitchen Physically Abused:   . Sexually Abused:      PHYSICAL EXAM  Vitals:   08/30/19 1433  BP: (!) 187/100  Pulse: 79  Weight: 134 lb (60.8 kg)  Height: 5\' 1"  (1.549 m)    Body mass index is 25.32 kg/m.   General: The patient is well-developed and well-nourished and in no acute distress  HEENT:  Head is  [place]/AT.  Sclera are anicteric.  Funduscopic exam shows normal optic discs and retinal vessels.   Head is slightly tender at temples.    Neck: No carotid bruits are noted.  The neck is nontender.  Cardiovascular: The heart has a regular rate and rhythm with a normal S1 and S2. There were no murmurs, gallops or rubs.    Skin: Extremities are without rash or edema.   Neurologic Exam  Mental status: The patient is alert and oriented x 3 at the time of the examination. The patient has apparent normal recent and remote memory, with an apparently normal attention span and concentration ability.  Speech is normal.  Cranial nerves: Extraocular movements are full. Pupils are equal, round, and reactive to light and accomodation.  Visual fields are full.  Facial symmetry is present. There is good facial sensation to soft touch bilaterally.Facial strength is normal.  Trapezius and sternocleidomastoid strength is normal. No dysarthria is noted.  The tongue is midline, and the patient has symmetric elevation of the soft palate. No obvious hearing deficits are noted.  Motor:  Muscle bulk is normal.   Tone is normal. Strength is  5 / 5 in all 4 extremities.   Sensory: Sensory testing is intact to pinprick, soft touch and vibration sensation in all 4 extremities except minimal reduced vibration in foot  Coordination: Cerebellar testing reveals good finger-nose-finger and heel-to-shin bilaterally.  Gait and station: Station is normal.   Gait is normal. Tandem gait is mildly wide. Romberg is negative.   Reflexes: Deep tendon reflexes are symmetric and normal in arms, 3 at the knees and 2 at the ankles.  There are no pathologic reflexes..   Plantar responses are flexor.    DIAGNOSTIC DATA (LABS, IMAGING, TESTING) - I reviewed patient records, labs, notes, testing and imaging myself where available.  Lab Results  Component Value Date   WBC 10.0 08/21/2019   HGB 12.9 08/21/2019   HCT 39.6 08/21/2019   MCV  92.5 08/21/2019   PLT 365 08/21/2019      Component Value Date/Time   NA 140 08/21/2019 2100   K 4.0 08/21/2019 2100   CL 106 08/21/2019 2100   CO2 27 08/21/2019 2100   GLUCOSE 102 (H) 08/21/2019 2100   BUN 12 08/21/2019 2100   CREATININE 0.84 08/21/2019 2100   CALCIUM 10.5 (H) 08/21/2019 2100   GFRNONAA >60 08/21/2019 2100   GFRAA >60 08/21/2019 2100       ASSESSMENT AND PLAN  Syncope and collapse - Plan: MR BRAIN W WO CONTRAST, EEG  Loss of consciousness (HCC) - Plan: MR BRAIN W WO CONTRAST, EEG   In summary, Ms. Judon is a 63 year old woman who had an episode of loss of consciousness 08/21/2019.  The sequence of events is unclear.  It is possible that she had a seizure and was found groggy by the husband when he returned.  She also could have either tripped or had syncope and then hit her head causing the grogginess.  It is unlikely that this was a straightforward vasovagal or cardiogenic syncope as she took hours to recover to baseline.  Regardless, we need to check several tests including an EEG and an MRI of the brain.  If the EEG is abnormal or if the MRI shows focus that could contribute to lowering the seizure threshold, I will start an antiepileptic agent.  Additionally if this occurs we will need to stop the Wellbutrin.  If normal, and another event occurs we will also need to reconsider initiating an antiepileptic drug.  They will return to see me if there are new or worsening neurologic symptoms and we will call with the results of the test.  Thank you for asking me to see Ms. Lucille Passy.  Please let me know if I can be of further assistance with her or other patients in the future.   Hilario Robarts A. Epimenio Foot, MD, [facility] 08/30/2019, 3:03 PM Certified in Neurology, Clinical Neurophysiology, Sleep Medicine and Neuroimaging  [facility] [address], [address] [place], [place] [address] [phone]

## 2019-09-04 ENCOUNTER — Ambulatory Visit: Payer: BC Managed Care – PPO | Admitting: Neurology

## 2019-09-04 ENCOUNTER — Telehealth: Payer: Self-pay | Admitting: Neurology

## 2019-09-04 DIAGNOSIS — R55 Syncope and collapse: Secondary | ICD-10-CM

## 2019-09-04 DIAGNOSIS — R4020 Unspecified coma: Secondary | ICD-10-CM

## 2019-09-04 NOTE — Telephone Encounter (Signed)
Spoke to patient husband who is on the dpr schedule for 09/05/19 at [facility].

## 2019-09-04 NOTE — Telephone Encounter (Signed)
LVM for pt to call back about scheduling mri  [employer] auth: [id number] (exp. 09/01/19 to 02/27/20)

## 2019-09-05 ENCOUNTER — Ambulatory Visit: Payer: BC Managed Care – PPO

## 2019-09-05 ENCOUNTER — Other Ambulatory Visit: Payer: Self-pay

## 2019-09-05 DIAGNOSIS — R4020 Unspecified coma: Secondary | ICD-10-CM | POA: Diagnosis not present

## 2019-09-05 DIAGNOSIS — R55 Syncope and collapse: Secondary | ICD-10-CM

## 2019-09-05 MED ORDER — GADOBENATE DIMEGLUMINE 529 MG/ML IV SOLN
11.0000 mL | Freq: Once | INTRAVENOUS | Status: AC | PRN
Start: 2019-09-05 — End: 2019-09-05
  Administered 2019-09-05: 11 mL via INTRAVENOUS

## 2019-09-18 NOTE — Progress Notes (Signed)
   [facility]  EEG (ELECTROENCEPHALOGRAM) REPORT   STUDY DATE: 09/04/2019 PATIENT NAME: Linda Osborne DOB: 10/05/1955 MRN: [id number]  ORDERING CLINICIAN: Michel Eskelson A. Epimenio Foot, MD. PhD  TECHNOLOGIST: Elvis Coil, RPSGT TECHNIQUE: Electroencephalogram was recorded utilizing standard 10-20 system of lead placement and reformatted into average and bipolar montages.  RECORDING TIME: 25 minutes 18 seconds  CLINICAL INFORMATION: 64 year old woman with episode of loss of consciousness  FINDINGS: A digital EEG was performed while the patient was awake and drowsy. While awake and most alert there was a 11 hz posterior dominant rhythm. Voltages and frequencies were symmetric.  There were no focal, lateralizing, epileptiform activity or seizures seen.  Photic stimulation had a normal driving response. Hyperventilation and recovery did not change the underlying rhythms. EKG channel shows normal sinus rhythm.  She became drowsy but did not enter definitive sleep.Marland Kitchen  IMPRESSION: This is a normal EEG while the patient was awake and drowsy.   INTERPRETING PHYSICIAN:   Tuleen Mandelbaum A. Epimenio Foot, MD, PhD, [facility] Certified in Neurology, Clinical Neurophysiology, Sleep Medicine, Pain Medicine and Neuroimaging  [facility] [address] [place], [place] [address] [phone]

## 2019-09-19 ENCOUNTER — Encounter: Payer: Self-pay | Admitting: *Deleted

## 2019-09-26 DIAGNOSIS — Z79891 Long term (current) use of opiate analgesic: Secondary | ICD-10-CM | POA: Diagnosis not present

## 2019-09-26 DIAGNOSIS — Z79899 Other long term (current) drug therapy: Secondary | ICD-10-CM | POA: Diagnosis not present

## 2019-09-26 DIAGNOSIS — G894 Chronic pain syndrome: Secondary | ICD-10-CM | POA: Diagnosis not present

## 2019-09-26 DIAGNOSIS — M5136 Other intervertebral disc degeneration, lumbar region: Secondary | ICD-10-CM | POA: Diagnosis not present

## 2019-10-26 DIAGNOSIS — F3342 Major depressive disorder, recurrent, in full remission: Secondary | ICD-10-CM | POA: Diagnosis not present

## 2019-10-26 DIAGNOSIS — F900 Attention-deficit hyperactivity disorder, predominantly inattentive type: Secondary | ICD-10-CM | POA: Diagnosis not present

## 2019-10-26 DIAGNOSIS — F410 Panic disorder [episodic paroxysmal anxiety] without agoraphobia: Secondary | ICD-10-CM | POA: Diagnosis not present

## 2020-01-18 DIAGNOSIS — M25511 Pain in right shoulder: Secondary | ICD-10-CM | POA: Diagnosis not present

## 2020-01-29 DIAGNOSIS — R5383 Other fatigue: Secondary | ICD-10-CM | POA: Diagnosis not present

## 2020-01-29 DIAGNOSIS — Z79899 Other long term (current) drug therapy: Secondary | ICD-10-CM | POA: Diagnosis not present

## 2020-01-29 DIAGNOSIS — Z23 Encounter for immunization: Secondary | ICD-10-CM | POA: Diagnosis not present

## 2020-01-29 DIAGNOSIS — I10 Essential (primary) hypertension: Secondary | ICD-10-CM | POA: Diagnosis not present

## 2020-01-29 DIAGNOSIS — R918 Other nonspecific abnormal finding of lung field: Secondary | ICD-10-CM | POA: Diagnosis not present

## 2020-01-29 DIAGNOSIS — F17201 Nicotine dependence, unspecified, in remission: Secondary | ICD-10-CM | POA: Diagnosis not present

## 2020-01-29 DIAGNOSIS — R946 Abnormal results of thyroid function studies: Secondary | ICD-10-CM | POA: Diagnosis not present

## 2020-01-29 DIAGNOSIS — M25511 Pain in right shoulder: Secondary | ICD-10-CM | POA: Diagnosis not present

## 2020-01-29 DIAGNOSIS — E559 Vitamin D deficiency, unspecified: Secondary | ICD-10-CM | POA: Diagnosis not present

## 2020-01-29 DIAGNOSIS — Z0000 Encounter for general adult medical examination without abnormal findings: Secondary | ICD-10-CM | POA: Diagnosis not present

## 2021-07-30 ENCOUNTER — Other Ambulatory Visit: Payer: Self-pay | Admitting: Internal Medicine

## 2021-07-30 DIAGNOSIS — Z1231 Encounter for screening mammogram for malignant neoplasm of breast: Secondary | ICD-10-CM

## 2021-07-30 DIAGNOSIS — M8589 Other specified disorders of bone density and structure, multiple sites: Secondary | ICD-10-CM

## 2021-09-09 LAB — COLOGUARD: COLOGUARD: NEGATIVE

## 2021-09-10 ENCOUNTER — Ambulatory Visit
Admission: RE | Admit: 2021-09-10 | Discharge: 2021-09-10 | Disposition: A | Discharge status: Home / Self Care | Payer: 59 | Source: Ambulatory Visit | Attending: Internal Medicine | Admitting: Internal Medicine

## 2021-09-10 DIAGNOSIS — Z1231 Encounter for screening mammogram for malignant neoplasm of breast: Secondary | ICD-10-CM

## 2021-11-09 ENCOUNTER — Other Ambulatory Visit: Payer: Self-pay

## 2021-11-09 ENCOUNTER — Encounter: Payer: Self-pay | Admitting: Emergency Medicine

## 2021-11-09 ENCOUNTER — Ambulatory Visit
Admission: EM | Admit: 2021-11-09 | Discharge: 2021-11-09 | Disposition: A | Discharge status: Home / Self Care | Payer: 59 | Attending: Physician Assistant | Admitting: Physician Assistant

## 2021-11-09 DIAGNOSIS — R220 Localized swelling, mass and lump, head: Secondary | ICD-10-CM | POA: Diagnosis not present

## 2021-11-09 MED ORDER — AMOXICILLIN-POT CLAVULANATE 875-125 MG PO TABS: 1.0000 | ORAL_TABLET | Freq: Two times a day (BID) | ORAL | 0 refills | Status: AC

## 2021-11-09 NOTE — ED Triage Notes (Signed)
Pt here for right sided facial swelling x 2 days

## 2021-11-09 NOTE — ED Provider Notes (Signed)
[name]    CSN: [id number] Arrival date & time: 11/09/21  1502      History   Chief Complaint Chief Complaint  Patient presents with   Facial Swelling    HPI Linda Osborne is a 66 y.o. female.   Patient here today for evaluation of right-sided facial swelling that she noticed 2 days ago.  She reports that before swelling started she had bitten the inside of her mouth and thinks this is what caused symptoms.  She has had similar in the past and required antibiotic therapy.  She denies any fever.  She has not had any nausea or vomiting.  She does not report treatment for symptoms.  The history is provided by the patient.    History reviewed. No pertinent past medical history.  Patient Active Problem List   Diagnosis Date Noted   Syncope and collapse 08/30/2019   Loss of consciousness (HCC) 08/30/2019    Past Surgical History:  Procedure Laterality Date   NO PAST SURGERIES      OB History   No obstetric history on file.      Home Medications    Prior to Admission medications   Medication Sig Start Date End Date Taking? Authorizing Provider  amoxicillin-clavulanate (AUGMENTIN) 875-125 MG tablet Take 1 tablet by mouth every 12 (twelve) hours. 11/09/21  Yes Tomi Bamberger, PA-C  amphetamine-dextroamphetamine (ADDERALL) 20 MG tablet Take 20 mg by mouth daily. 07/21/16   [provider]  amphetamine-dextroamphetamine (ADDERALL) 30 MG tablet Take 1 tablet by mouth. 1 am, 1 at noon    [provider]  buPROPion (WELLBUTRIN XL) 300 MG 24 hr tablet Take 300 mg by mouth every morning. 08/23/19   [provider]  FLUoxetine (PROZAC) 20 MG capsule Take 60 mg by mouth daily. 08/23/19   [provider]  Oxycodone HCl 10 MG TABS Take 10 mg by mouth 5 (five) times daily as needed. 08/16/19   [provider]  traZODone (DESYREL) 50 MG tablet Take 1-3 tablets by mouth at bedtime. 08/10/16   [provider]    Family  History Family History  Problem Relation Age of Onset   Breast cancer Neg Hx     Social History Social History   Tobacco Use   Smoking status: Former    Packs/day: 0.50    Years: 20.00    Total pack years: 10.00    Types: Cigarettes    Quit date: 08/13/2010    Years since quitting: 11.2   Smokeless tobacco: Never  Substance Use Topics   Alcohol use: Not Currently   Drug use: Never     Allergies   Patient has no known allergies.   Review of Systems Review of Systems  Constitutional:  Negative for chills and fever.  HENT:  Positive for facial swelling. Negative for mouth sores.   Eyes:  Negative for discharge and redness.  Respiratory:  Negative for shortness of breath.   Gastrointestinal:  Negative for nausea and vomiting.     Physical Exam Triage Vital Signs ED Triage Vitals  Enc Vitals Group     BP 11/09/21 1522 120/71     Pulse Rate 11/09/21 1522 (!) 109     Resp 11/09/21 1522 18     Temp 11/09/21 1522 98.4 F (36.9 C)     Temp Source 11/09/21 1522 Oral     SpO2 11/09/21 1522 95 %     Weight --      Height --  Head Circumference --      Peak Flow --      Pain Score 11/09/21 1523 8     Pain Loc --      Pain Edu? --      Excl. in GC? --    No data found.  Updated Vital Signs BP 120/71 (BP Location: Left Arm)   Pulse (!) 109   Temp 98.4 F (36.9 C) (Oral)   Resp 18   SpO2 95%      Physical Exam Vitals and nursing note reviewed.  Constitutional:      General: She is not in acute distress.    Appearance: Normal appearance. She is not ill-appearing.  HENT:     Head: Normocephalic and atraumatic.     Comments: Facial swelling noted to right mandibular area    Mouth/Throat:     Comments: Poor dentition throughout Eyes:     Conjunctiva/sclera: Conjunctivae normal.  Cardiovascular:     Rate and Rhythm: Normal rate.  Pulmonary:     Effort: Pulmonary effort is normal.  Neurological:     Mental Status: She is alert.  Psychiatric:         Mood and Affect: Mood normal.        Behavior: Behavior normal.        Thought Content: Thought content normal.      UC Treatments / Results  Labs (all labs ordered are listed, but only abnormal results are displayed) Labs Reviewed - No data to display  EKG   Radiology No results found.  Procedures Procedures (including critical care time)  Medications Ordered in UC Medications - No data to display  Initial Impression / Assessment and Plan / UC Course  I have reviewed the triage vital signs and the nursing notes.  Pertinent labs & imaging results that were available during my care of the patient were reviewed by me and considered in my medical decision making (see chart for details).    Went prescribed to cover possible infection.  Recommended follow-up if no improvement of symptoms with antibiotics or sooner with any worsening.  Patient expresses understanding.  Final Clinical Impressions(s) / UC Diagnoses   Final diagnoses:  Facial swelling   Discharge Instructions   None    ED Prescriptions     Medication Sig Dispense Auth. Provider   amoxicillin-clavulanate (AUGMENTIN) 875-125 MG tablet Take 1 tablet by mouth every 12 (twelve) hours. 14 tablet Tomi Bamberger, PA-C      PDMP not reviewed this encounter.   Tomi Bamberger, PA-C 11/09/21 1555

## 2021-12-17 DIAGNOSIS — M25512 Pain in left shoulder: Secondary | ICD-10-CM | POA: Diagnosis not present

## 2021-12-17 DIAGNOSIS — M25511 Pain in right shoulder: Secondary | ICD-10-CM | POA: Diagnosis not present

## 2022-02-13 ENCOUNTER — Other Ambulatory Visit: Payer: Self-pay | Admitting: Internal Medicine

## 2022-02-13 ENCOUNTER — Inpatient Hospital Stay: Admission: RE | Admit: 2022-02-13 | Payer: BC Managed Care – PPO | Source: Ambulatory Visit

## 2022-02-13 DIAGNOSIS — M8589 Other specified disorders of bone density and structure, multiple sites: Secondary | ICD-10-CM

## 2022-03-19 DIAGNOSIS — M5136 Other intervertebral disc degeneration, lumbar region: Secondary | ICD-10-CM | POA: Diagnosis not present

## 2022-03-19 DIAGNOSIS — G894 Chronic pain syndrome: Secondary | ICD-10-CM | POA: Diagnosis not present

## 2022-03-19 DIAGNOSIS — Z79891 Long term (current) use of opiate analgesic: Secondary | ICD-10-CM | POA: Diagnosis not present

## 2022-04-09 NOTE — Progress Notes (Signed)
Surgery orders requested via Epic inbox. °

## 2022-04-14 NOTE — H&P (Signed)
Patient's anticipated LOS is less than 2 midnights, meeting these requirements: - Younger than 103 - Lives within 1 hour of care - Has a competent adult at home to recover with post-op recover - NO history of  - Chronic pain requiring opiods  - Diabetes  - Coronary Artery Disease  - Heart failure  - Heart attack  - Stroke  - DVT/VTE  - Cardiac arrhythmia  - Respiratory Failure/COPD  - Renal failure  - Anemia  - Advanced Liver disease     Linda Osborne is an 67 y.o. female.    Chief Complaint: right shoulder pin  HPI: Pt is a 67 y.o. female complaining of right shoulder pain for multiple years. Pain had continually increased since the beginning. X-rays in the clinic show end-stage arthritic changes of the right shoulder. Pt has tried various conservative treatments which have failed to alleviate their symptoms, including injections and therapy. Various options are discussed with the patient. Risks, benefits and expectations were discussed with the patient. Patient understand the risks, benefits and expectations and wishes to proceed with surgery.   PCP:  Josetta Huddle, MD  D/C Plans: Home  PMH: No past medical history on file.  PSH: Past Surgical History:  Procedure Laterality Date   NO PAST SURGERIES      Social History:  reports that she quit smoking about 11 years ago. Her smoking use included cigarettes. She has a 10.00 pack-year smoking history. She has never used smokeless tobacco. She reports that she does not currently use alcohol. She reports that she does not use drugs. BMI: Estimated body mass index is 25.32 kg/m as calculated from the following:   Height as of 08/30/19: 5\' 1"  (1.549 m).   Weight as of 08/30/19: 60.8 kg.  No results found for: "ALBUMIN" Diabetes: Patient does not have a diagnosis of diabetes.     Smoking Status:      Allergies:  No Known Allergies  Medications: No current facility-administered medications for this encounter.    Current Outpatient Medications  Medication Sig Dispense Refill   amoxicillin-clavulanate (AUGMENTIN) 875-125 MG tablet Take 1 tablet by mouth every 12 (twelve) hours. 14 tablet 0   amphetamine-dextroamphetamine (ADDERALL) 20 MG tablet Take 20 mg by mouth daily.     amphetamine-dextroamphetamine (ADDERALL) 30 MG tablet Take 1 tablet by mouth. 1 am, 1 at noon     buPROPion (WELLBUTRIN XL) 300 MG 24 hr tablet Take 300 mg by mouth every morning.     FLUoxetine (PROZAC) 20 MG capsule Take 60 mg by mouth daily.     Oxycodone HCl 10 MG TABS Take 10 mg by mouth 5 (five) times daily as needed.     traZODone (DESYREL) 50 MG tablet Take 1-3 tablets by mouth at bedtime.      No results found for this or any previous visit (from the past 48 hour(s)). No results found.  ROS: Pain with rom of the right upper extremity  Physical Exam: Alert and oriented 66 y.o. female in no acute distress Cranial nerves 2-12 intact Cervical spine: full rom with no tenderness, nv intact distally Chest: active breath sounds bilaterally, no wheeze rhonchi or rales Heart: regular rate and rhythm, no murmur Abd: non tender non distended with active bowel sounds Hip is stable with rom  Right shoulder painful and weak rom Nv intact distally No rashes or edema   Assessment/Plan Assessment: right shoulder cuff arthropathy  Plan:  Patient will undergo a right reverse total shoulder by Dr.  Norris at [facility] Risks benefits and expectations were discussed with the patient. Patient understand risks, benefits and expectations and wishes to proceed. Preoperative templating of the joint replacement has been completed, documented, and submitted to the Operating Room personnel in order to optimize intra-operative equipment management.   Merla Riches PA-C, MPAS [facility] is now [employer] [address] [place], [place], [place] [address] Phone:  [phone] [email or web address] Facebook  [employer]

## 2022-04-14 NOTE — Progress Notes (Signed)
Patient had to watch grandchildren today.Completed interview over the phone. Patient is coming in for labs 04/17/22 at 1300. Went over instructions verbally and will provide physical copy when she comes in for labs.  COVID Vaccine Completed: yes  Date of COVID positive in last 90 days: no  PCP - Josetta Huddle, MD Cardiologist - n/a  Chest x-ray - n/a EKG - need when comes in for labs Stress Test - n/a ECHO -  n/a Cardiac Cath - n/a Pacemaker/ICD device last checked: n/a Spinal Cord Stimulator: n/a  Bowel Prep - no  Sleep Study - yes, negative CPAP -   Fasting Blood Sugar - n/a Checks Blood Sugar _____ times a day  Last dose of GLP1 agonist-  N/A GLP1 instructions:  N/A   Last dose of SGLT-2 inhibitors-  N/A SGLT-2 instructions: N/A   Blood Thinner Instructions: n/a Aspirin Instructions: Last Dose:  Activity level: Can go up a flight of stairs and perform activities of daily living without stopping and without symptoms of chest pain or shortness of breath.  Anesthesia review:   Patient denies shortness of breath, fever, cough and chest pain at PAT appointment  Patient verbalized understanding of instructions that were given to them at the PAT appointment. Patient was also instructed that they will need to review over the PAT instructions again at home before surgery.

## 2022-04-14 NOTE — Patient Instructions (Signed)
SURGICAL WAITING ROOM VISITATION  Patients having surgery or a procedure may have no more than 2 support people in the waiting area - these visitors may rotate.    Children under the age of 29 must have an adult with them who is not the patient.  Due to an increase in RSV and influenza rates and associated hospitalizations, children ages 31 and under may not visit patients in [place].  If the patient needs to stay at the hospital during part of their recovery, the visitor guidelines for inpatient rooms apply. Pre-op nurse will coordinate an appropriate time for 1 support person to accompany patient in pre-op.  This support person may not rotate.    Please refer to the [facility] website for the visitor guidelines for Inpatients (after your surgery is over and you are in a regular room).    Your procedure is scheduled on: 05/01/22   Report to [facility] Main Entrance    Report to admitting at 10:05 AM   Call this number if you have problems the morning of surgery [phone]   Do not eat food :After Midnight.   After Midnight you may have the following liquids until 9:35 AM DAY OF SURGERY  Water Non-Citrus Juices (without pulp, NO RED-Apple, White grape, White cranberry) Black Coffee (NO MILK/CREAM OR CREAMERS, sugar ok)  Clear Tea (NO MILK/CREAM OR CREAMERS, sugar ok) regular and decaf                             Plain Jell-O (NO RED)                                           Fruit ices (not with fruit pulp, NO RED)                                     Popsicles (NO RED)                                                               Sports drinks like Gatorade (NO RED)                 The day of surgery:  Drink ONE (1) Pre-Surgery Clear Ensure or G2 at AM the morning of surgery. Drink in one sitting. Do not sip.  This drink was given to you during your hospital  pre-op appointment visit. Nothing else to drink after completing the  Pre-Surgery Clear Ensure  or G2.          If you have questions, please contact your surgeon's office.   FOLLOW BOWEL PREP AND ANY ADDITIONAL PRE OP INSTRUCTIONS YOU RECEIVED FROM YOUR SURGEON'S OFFICE!!!     Oral Hygiene is also important to reduce your risk of infection.                                    Remember - BRUSH YOUR TEETH THE MORNING OF SURGERY WITH YOUR REGULAR TOOTHPASTE  DENTURES WILL BE REMOVED PRIOR TO SURGERY PLEASE DO NOT APPLY "Poly grip" OR ADHESIVES!!!   Do NOT smoke after Midnight   Take these medicines the morning of surgery with A SIP OF WATER: Bupropion, Fluoxetine, Oxycodone   DO NOT TAKE ANY ORAL DIABETIC MEDICATIONS DAY OF YOUR SURGERY  Bring CPAP mask and tubing day of surgery.                              You may not have any metal on your body including hair pins, jewelry, and body piercing             Do not wear make-up, lotions, powders, perfumes, or deodorant  Do not wear nail polish including gel and S&S, artificial/acrylic nails, or any other type of covering on natural nails including finger and toenails. If you have artificial nails, gel coating, etc. that needs to be removed by a nail salon please have this removed prior to surgery or surgery may need to be canceled/ delayed if the surgeon/ anesthesia feels like they are unable to be safely monitored.   Do not shave  48 hours prior to surgery.    Do not bring valuables to the hospital. [place].   Contacts, glasses, dentures or bridgework may not be worn into surgery.   Bring small overnight bag day of surgery.   DO NOT Justin. PHARMACY WILL DISPENSE MEDICATIONS LISTED ON YOUR MEDICATION LIST TO YOU DURING YOUR ADMISSION [place]!   Special Instructions: Bring a copy of your healthcare power of attorney and living will documents the day of surgery if you haven't scanned them before.              Please read over the  following fact sheets you were given: IF [place] [phone] Schneiders    If you received a COVID test during your pre-op visit  it is requested that you wear a mask when out in public, stay away from anyone that may not be feeling well and notify your surgeon if you develop symptoms. If you test positive for Covid or have been in contact with anyone that has tested positive in the last 10 days please notify you surgeon.    [employer] - Preparing for Surgery Before surgery, you can play an important role.  Because skin is not sterile, your skin needs to be as free of germs as possible.  You can reduce the number of germs on your skin by washing with CHG (chlorahexidine gluconate) soap before surgery.  CHG is an antiseptic cleaner which kills germs and bonds with the skin to continue killing germs even after washing. Please DO NOT use if you have an allergy to CHG or antibacterial soaps.  If your skin becomes reddened/irritated stop using the CHG and inform your nurse when you arrive at Short Stay. Do not shave (including legs and underarms) for at least 48 hours prior to the first CHG shower.  You may shave your face/neck.  Please follow these instructions carefully:  1.  Shower with CHG Soap the night before surgery and the  morning of surgery.  2.  If you choose to wash your hair, wash your hair first as usual with your normal  shampoo.  3.  After you shampoo, rinse your hair and body thoroughly to remove the shampoo.                             4.  Use CHG as you would any other liquid soap.  You can apply chg directly to the skin and wash.  Gently with a scrungie or clean washcloth.  5.  Apply the CHG Soap to your body ONLY FROM THE NECK DOWN.   Do   not use on face/ open                           Wound or open sores. Avoid contact with eyes, ears mouth and   genitals (private parts).                       Wash face,  Genitals (private parts)  with your normal soap.             6.  Wash thoroughly, paying special attention to the area where your    surgery  will be performed.  7.  Thoroughly rinse your body with warm water from the neck down.  8.  DO NOT shower/wash with your normal soap after using and rinsing off the CHG Soap.                9.  Pat yourself dry with a clean towel.            10.  Wear clean pajamas.            11.  Place clean sheets on your bed the night of your first shower and do not  sleep with pets. Day of Surgery : Do not apply any lotions/deodorants the morning of surgery.  Please wear clean clothes to the hospital/surgery center.  FAILURE TO FOLLOW THESE INSTRUCTIONS MAY RESULT IN THE CANCELLATION OF YOUR SURGERY  PATIENT SIGNATURE_________________________________  NURSE SIGNATURE__________________________________  ________________________________________________________________________

## 2022-04-14 NOTE — Progress Notes (Signed)
Please place orders for PAT appointment scheduled 04/16/22. 

## 2022-04-16 ENCOUNTER — Encounter (HOSPITAL_COMMUNITY): Payer: Self-pay

## 2022-04-16 ENCOUNTER — Encounter (HOSPITAL_COMMUNITY)
Admission: RE | Admit: 2022-04-16 | Discharge: 2022-04-16 | Disposition: A | Discharge status: Home / Self Care | Payer: Medicare Other | Source: Ambulatory Visit | Attending: Orthopedic Surgery | Admitting: Orthopedic Surgery

## 2022-04-16 VITALS — Ht 61.5 in | Wt 128.0 lb

## 2022-04-16 DIAGNOSIS — Z01818 Encounter for other preprocedural examination: Secondary | ICD-10-CM

## 2022-04-16 DIAGNOSIS — I10 Essential (primary) hypertension: Secondary | ICD-10-CM

## 2022-04-16 HISTORY — DX: Unspecified osteoarthritis, unspecified site: M19.90

## 2022-04-16 HISTORY — DX: Depression, unspecified: F32.A

## 2022-04-16 HISTORY — DX: Headache, unspecified: R51.9

## 2022-04-16 HISTORY — DX: Essential (primary) hypertension: I10

## 2022-04-16 HISTORY — DX: Anxiety disorder, unspecified: F41.9

## 2022-04-16 HISTORY — DX: Gastro-esophageal reflux disease without esophagitis: K21.9

## 2022-04-17 ENCOUNTER — Encounter (HOSPITAL_COMMUNITY)
Admission: RE | Admit: 2022-04-17 | Discharge: 2022-04-17 | Disposition: A | Discharge status: Home / Self Care | Payer: Medicare Other | Source: Ambulatory Visit | Attending: Orthopedic Surgery | Admitting: Orthopedic Surgery

## 2022-04-17 DIAGNOSIS — Z01818 Encounter for other preprocedural examination: Secondary | ICD-10-CM | POA: Diagnosis not present

## 2022-04-17 DIAGNOSIS — I10 Essential (primary) hypertension: Secondary | ICD-10-CM

## 2022-04-17 DIAGNOSIS — R9431 Abnormal electrocardiogram [ECG] [EKG]: Secondary | ICD-10-CM | POA: Insufficient documentation

## 2022-04-17 DIAGNOSIS — I517 Cardiomegaly: Secondary | ICD-10-CM | POA: Insufficient documentation

## 2022-04-17 LAB — BASIC METABOLIC PANEL
Anion gap: 9 (ref 5–15)
BUN: 18 mg/dL (ref 8–23)
CO2: 25 mmol/L (ref 22–32)
Calcium: 10.2 mg/dL (ref 8.9–10.3)
Chloride: 105 mmol/L (ref 98–111)
Creatinine, Ser: 0.99 mg/dL (ref 0.44–1.00)
GFR, Estimated: >60 mL/min (ref >60)
Glucose, Bld: 104 mg/dL — ABNORMAL HIGH (ref 70–99)
Potassium: 3.8 mmol/L (ref 3.5–5.1)
Sodium: 139 mmol/L (ref 135–145)

## 2022-04-17 LAB — CBC
HCT: 40.7 % (ref 36.0–46.0)
Hemoglobin: 13.3 g/dL (ref 12.0–15.0)
MCH: 29.4 pg (ref 26.0–34.0)
MCHC: 32.7 g/dL (ref 30.0–36.0)
MCV: 90 fL (ref 80.0–100.0)
Platelets: 394 10*3/uL (ref 150–400)
RBC: 4.52 MIL/uL (ref 3.87–5.11)
RDW: 12.5 % (ref 11.5–15.5)
WBC: 8 10*3/uL (ref 4.0–10.5)
nRBC: 0 % (ref 0.0–0.2)

## 2022-04-17 LAB — SURGICAL PCR SCREEN
MRSA, PCR: NEGATIVE
Staphylococcus aureus: POSITIVE — AB

## 2022-05-01 ENCOUNTER — Observation Stay (HOSPITAL_COMMUNITY)
Admission: RE | Admit: 2022-05-01 | Discharge: 2022-05-02 | Disposition: A | Discharge status: Home / Self Care | Payer: Medicare Other | Attending: Orthopedic Surgery | Admitting: Orthopedic Surgery

## 2022-05-01 ENCOUNTER — Encounter (HOSPITAL_COMMUNITY): Payer: Self-pay | Admitting: Orthopedic Surgery

## 2022-05-01 ENCOUNTER — Ambulatory Visit (HOSPITAL_COMMUNITY): Payer: Medicare Other | Admitting: Physician Assistant

## 2022-05-01 ENCOUNTER — Ambulatory Visit (HOSPITAL_BASED_OUTPATIENT_CLINIC_OR_DEPARTMENT_OTHER): Payer: Medicare Other | Admitting: Anesthesiology

## 2022-05-01 ENCOUNTER — Ambulatory Visit (HOSPITAL_COMMUNITY): Payer: Medicare Other

## 2022-05-01 ENCOUNTER — Other Ambulatory Visit: Payer: Self-pay

## 2022-05-01 ENCOUNTER — Encounter (HOSPITAL_COMMUNITY)
Admission: RE | Disposition: A | Discharge status: Home / Self Care | Payer: Self-pay | Source: Home / Self Care | Attending: Orthopedic Surgery

## 2022-05-01 DIAGNOSIS — M19011 Primary osteoarthritis, right shoulder: Secondary | ICD-10-CM

## 2022-05-01 DIAGNOSIS — Z87891 Personal history of nicotine dependence: Secondary | ICD-10-CM | POA: Insufficient documentation

## 2022-05-01 DIAGNOSIS — Z79899 Other long term (current) drug therapy: Secondary | ICD-10-CM | POA: Insufficient documentation

## 2022-05-01 DIAGNOSIS — I10 Essential (primary) hypertension: Secondary | ICD-10-CM | POA: Insufficient documentation

## 2022-05-01 DIAGNOSIS — Z471 Aftercare following joint replacement surgery: Secondary | ICD-10-CM | POA: Diagnosis not present

## 2022-05-01 DIAGNOSIS — Z96611 Presence of right artificial shoulder joint: Secondary | ICD-10-CM | POA: Diagnosis not present

## 2022-05-01 DIAGNOSIS — G8918 Other acute postprocedural pain: Secondary | ICD-10-CM | POA: Diagnosis not present

## 2022-05-01 HISTORY — PX: REVERSE SHOULDER ARTHROPLASTY: SHX5054

## 2022-05-01 SURGERY — ARTHROPLASTY, SHOULDER, TOTAL, REVERSE
Anesthesia: General | Site: Shoulder | Laterality: Right

## 2022-05-01 MED ORDER — ACETAMINOPHEN 325 MG PO TABS
650.0000 mg | ORAL_TABLET | Freq: Four times a day (QID) | ORAL | Status: DC | PRN
  Filled 2022-05-01: qty 2

## 2022-05-01 MED ORDER — HYDROMORPHONE HCL 1 MG/ML IJ SOLN
INTRAMUSCULAR | Status: AC
  Filled 2022-05-01: qty 1

## 2022-05-01 MED ORDER — TRANEXAMIC ACID-NACL 1000-0.7 MG/100ML-% IV SOLN
1000.0000 mg | Freq: Once | INTRAVENOUS | Status: AC
  Administered 2022-05-01: 1000 mg via INTRAVENOUS
  Filled 2022-05-01: qty 100

## 2022-05-01 MED ORDER — ACETAMINOPHEN 325 MG PO TABS
325.0000 mg | ORAL_TABLET | Freq: Four times a day (QID) | ORAL | Status: DC | PRN
  Administered 2022-05-02: 650 mg via ORAL

## 2022-05-01 MED ORDER — OMEGA-3-ACID ETHYL ESTERS 1 G PO CAPS
1.0000 g | ORAL_CAPSULE | Freq: Every day | ORAL | Status: DC
  Administered 2022-05-02: 1 g via ORAL
  Filled 2022-05-01: qty 1

## 2022-05-01 MED ORDER — LACTATED RINGERS IV SOLN: INTRAVENOUS | Status: DC

## 2022-05-01 MED ORDER — CHLORHEXIDINE GLUCONATE 0.12 % MT SOLN
15.0000 mL | Freq: Once | OROMUCOSAL | Status: AC
  Administered 2022-05-01: 15 mL via OROMUCOSAL

## 2022-05-01 MED ORDER — PHENYLEPHRINE HCL (PRESSORS) 10 MG/ML IV SOLN
INTRAVENOUS | Status: DC | PRN
  Administered 2022-05-01: 100 ug via INTRAVENOUS

## 2022-05-01 MED ORDER — METOCLOPRAMIDE HCL 5 MG/ML IJ SOLN: 5.0000 mg | Freq: Three times a day (TID) | INTRAMUSCULAR | Status: DC | PRN

## 2022-05-01 MED ORDER — SODIUM CHLORIDE 0.9 % IR SOLN
Status: DC | PRN
  Administered 2022-05-01: 1000 mL

## 2022-05-01 MED ORDER — DOCUSATE SODIUM 100 MG PO CAPS
100.0000 mg | ORAL_CAPSULE | Freq: Two times a day (BID) | ORAL | Status: DC
  Administered 2022-05-01 – 2022-05-02 (×2): 100 mg via ORAL
  Filled 2022-05-01 (×2): qty 1

## 2022-05-01 MED ORDER — AMPHETAMINE-DEXTROAMPHETAMINE 10 MG PO TABS
20.0000 mg | ORAL_TABLET | Freq: Three times a day (TID) | ORAL | Status: DC
  Administered 2022-05-01 – 2022-05-02 (×3): 20 mg via ORAL
  Filled 2022-05-01 (×3): qty 2

## 2022-05-01 MED ORDER — TRANEXAMIC ACID-NACL 1000-0.7 MG/100ML-% IV SOLN
1000.0000 mg | INTRAVENOUS | Status: AC
  Administered 2022-05-01: 1000 mg via INTRAVENOUS
  Filled 2022-05-01: qty 100

## 2022-05-01 MED ORDER — METOCLOPRAMIDE HCL 5 MG PO TABS: 5.0000 mg | ORAL_TABLET | Freq: Three times a day (TID) | ORAL | Status: DC | PRN

## 2022-05-01 MED ORDER — PHENYLEPHRINE HCL-NACL 20-0.9 MG/250ML-% IV SOLN
INTRAVENOUS | Status: DC | PRN
  Administered 2022-05-01: 40 ug/min via INTRAVENOUS

## 2022-05-01 MED ORDER — CEFAZOLIN SODIUM-DEXTROSE 2-4 GM/100ML-% IV SOLN
2.0000 g | Freq: Four times a day (QID) | INTRAVENOUS | Status: AC
  Administered 2022-05-01 – 2022-05-02 (×3): 2 g via INTRAVENOUS
  Filled 2022-05-01 (×4): qty 100

## 2022-05-01 MED ORDER — ADULT MULTIVITAMIN W/MINERALS CH
1.0000 | ORAL_TABLET | Freq: Every day | ORAL | Status: DC
  Administered 2022-05-02: 1 via ORAL
  Filled 2022-05-01: qty 1

## 2022-05-01 MED ORDER — FENTANYL CITRATE PF 50 MCG/ML IJ SOSY
50.0000 ug | PREFILLED_SYRINGE | INTRAMUSCULAR | Status: DC
  Administered 2022-05-01 (×2): 50 ug via INTRAVENOUS
  Administered 2022-05-01 (×2): 25 ug via INTRAVENOUS
  Filled 2022-05-01: qty 2

## 2022-05-01 MED ORDER — ONDANSETRON HCL 4 MG/2ML IJ SOLN: 4.0000 mg | Freq: Four times a day (QID) | INTRAMUSCULAR | Status: DC | PRN

## 2022-05-01 MED ORDER — OXYCODONE HCL 5 MG PO TABS: 5.0000 mg | ORAL_TABLET | Freq: Once | ORAL | Status: DC | PRN

## 2022-05-01 MED ORDER — METHOCARBAMOL 500 MG IVPB - SIMPLE MED
INTRAVENOUS | Status: AC
  Filled 2022-05-01: qty 55

## 2022-05-01 MED ORDER — OXYCODONE HCL 10 MG PO TABS: 10.0000 mg | ORAL_TABLET | Freq: Every day | ORAL | 0 refills | Status: AC | PRN

## 2022-05-01 MED ORDER — ORAL CARE MOUTH RINSE: 15.0000 mL | OROMUCOSAL | Status: DC | PRN

## 2022-05-01 MED ORDER — DEXAMETHASONE SODIUM PHOSPHATE 10 MG/ML IJ SOLN
INTRAMUSCULAR | Status: DC | PRN
  Administered 2022-05-01: 10 mg via INTRAVENOUS

## 2022-05-01 MED ORDER — ROCURONIUM BROMIDE 10 MG/ML (PF) SYRINGE
PREFILLED_SYRINGE | INTRAVENOUS | Status: AC
  Filled 2022-05-01: qty 10

## 2022-05-01 MED ORDER — ONDANSETRON HCL 4 MG PO TABS: 4.0000 mg | ORAL_TABLET | Freq: Four times a day (QID) | ORAL | Status: DC | PRN

## 2022-05-01 MED ORDER — PROPOFOL 10 MG/ML IV BOLUS
INTRAVENOUS | Status: DC | PRN
  Administered 2022-05-01: 100 mg via INTRAVENOUS

## 2022-05-01 MED ORDER — SODIUM CHLORIDE 0.9 % IV SOLN: INTRAVENOUS | Status: DC

## 2022-05-01 MED ORDER — TRAZODONE HCL 50 MG PO TABS
50.0000 mg | ORAL_TABLET | Freq: Every day | ORAL | Status: DC
  Administered 2022-05-01: 50 mg via ORAL
  Filled 2022-05-01: qty 1

## 2022-05-01 MED ORDER — FLUOXETINE HCL 20 MG PO CAPS
60.0000 mg | ORAL_CAPSULE | Freq: Every day | ORAL | Status: DC
  Administered 2022-05-02: 60 mg via ORAL
  Filled 2022-05-01: qty 3

## 2022-05-01 MED ORDER — METHOCARBAMOL 500 MG IVPB - SIMPLE MED
500.0000 mg | Freq: Four times a day (QID) | INTRAVENOUS | Status: DC | PRN
  Administered 2022-05-01: 500 mg via INTRAVENOUS

## 2022-05-01 MED ORDER — FENTANYL CITRATE (PF) 100 MCG/2ML IJ SOLN: INTRAMUSCULAR | Status: DC | PRN

## 2022-05-01 MED ORDER — KETOROLAC TROMETHAMINE 30 MG/ML IJ SOLN: 30.0000 mg | Freq: Once | INTRAMUSCULAR | Status: DC | PRN

## 2022-05-01 MED ORDER — BUPIVACAINE LIPOSOME 1.3 % IJ SUSP
INTRAMUSCULAR | Status: DC | PRN
  Administered 2022-05-01: 10 mL via PERINEURAL

## 2022-05-01 MED ORDER — MENTHOL 3 MG MT LOZG: 1.0000 | LOZENGE | OROMUCOSAL | Status: DC | PRN

## 2022-05-01 MED ORDER — BUPIVACAINE-EPINEPHRINE (PF) 0.25% -1:200000 IJ SOLN
INTRAMUSCULAR | Status: AC
  Filled 2022-05-01: qty 30

## 2022-05-01 MED ORDER — ONDANSETRON HCL 4 MG/2ML IJ SOLN
INTRAMUSCULAR | Status: DC | PRN
  Administered 2022-05-01: 4 mg via INTRAVENOUS

## 2022-05-01 MED ORDER — FAMOTIDINE 20 MG PO TABS: 20.0000 mg | ORAL_TABLET | Freq: Every day | ORAL | Status: DC | PRN

## 2022-05-01 MED ORDER — BUPIVACAINE-EPINEPHRINE (PF) 0.25% -1:200000 IJ SOLN
INTRAMUSCULAR | Status: DC | PRN
  Administered 2022-05-01: 11 mL

## 2022-05-01 MED ORDER — STERILE WATER FOR IRRIGATION IR SOLN
Status: DC | PRN
  Administered 2022-05-01: 2000 mL

## 2022-05-01 MED ORDER — ONDANSETRON HCL 4 MG/2ML IJ SOLN: 4.0000 mg | Freq: Once | INTRAMUSCULAR | Status: DC | PRN

## 2022-05-01 MED ORDER — LIDOCAINE HCL (CARDIAC) PF 100 MG/5ML IV SOSY
PREFILLED_SYRINGE | INTRAVENOUS | Status: DC | PRN
  Administered 2022-05-01: 60 mg via INTRAVENOUS

## 2022-05-01 MED ORDER — VITAMIN D 25 MCG (1000 UNIT) PO TABS
1000.0000 [IU] | ORAL_TABLET | Freq: Every day | ORAL | Status: DC
  Administered 2022-05-02: 1000 [IU] via ORAL
  Filled 2022-05-01: qty 1

## 2022-05-01 MED ORDER — DEXAMETHASONE SODIUM PHOSPHATE 10 MG/ML IJ SOLN
INTRAMUSCULAR | Status: AC
  Filled 2022-05-01: qty 1

## 2022-05-01 MED ORDER — POLYETHYLENE GLYCOL 3350 17 G PO PACK: 17.0000 g | PACK | Freq: Every day | ORAL | Status: DC | PRN

## 2022-05-01 MED ORDER — SUCCINYLCHOLINE CHLORIDE 200 MG/10ML IV SOSY
PREFILLED_SYRINGE | INTRAVENOUS | Status: DC | PRN
  Administered 2022-05-01: 100 mg via INTRAVENOUS

## 2022-05-01 MED ORDER — METHOCARBAMOL 500 MG PO TABS: 500.0000 mg | ORAL_TABLET | Freq: Three times a day (TID) | ORAL | 1 refills | Status: AC | PRN

## 2022-05-01 MED ORDER — OXYCODONE HCL 5 MG/5ML PO SOLN: 5.0000 mg | Freq: Once | ORAL | Status: DC | PRN

## 2022-05-01 MED ORDER — FENTANYL CITRATE (PF) 100 MCG/2ML IJ SOLN
INTRAMUSCULAR | Status: AC
  Filled 2022-05-01: qty 2

## 2022-05-01 MED ORDER — HYDROMORPHONE HCL 1 MG/ML IJ SOLN
0.5000 mg | INTRAMUSCULAR | Status: DC | PRN
  Administered 2022-05-01 (×2): 0.5 mg via INTRAVENOUS
  Administered 2022-05-02: 1 mg via INTRAVENOUS
  Filled 2022-05-01 (×2): qty 1

## 2022-05-01 MED ORDER — OXYCODONE HCL 5 MG PO TABS
10.0000 mg | ORAL_TABLET | Freq: Four times a day (QID) | ORAL | Status: DC | PRN
  Administered 2022-05-01 – 2022-05-02 (×4): 10 mg via ORAL
  Filled 2022-05-01 (×5): qty 2

## 2022-05-01 MED ORDER — METHOCARBAMOL 500 MG PO TABS
500.0000 mg | ORAL_TABLET | Freq: Four times a day (QID) | ORAL | Status: DC | PRN
  Administered 2022-05-01 – 2022-05-02 (×2): 500 mg via ORAL
  Filled 2022-05-01 (×2): qty 1

## 2022-05-01 MED ORDER — PHENOL 1.4 % MT LIQD: 1.0000 | OROMUCOSAL | Status: DC | PRN

## 2022-05-01 MED ORDER — MIDAZOLAM HCL 2 MG/2ML IJ SOLN
1.0000 mg | INTRAMUSCULAR | Status: DC
  Administered 2022-05-01: 2 mg via INTRAVENOUS
  Filled 2022-05-01: qty 2

## 2022-05-01 MED ORDER — BUPIVACAINE HCL (PF) 0.5 % IJ SOLN
INTRAMUSCULAR | Status: DC | PRN
  Administered 2022-05-01: 15 mL via PERINEURAL

## 2022-05-01 MED ORDER — ONDANSETRON HCL 4 MG PO TABS: 4.0000 mg | ORAL_TABLET | Freq: Three times a day (TID) | ORAL | 1 refills | Status: AC | PRN

## 2022-05-01 MED ORDER — AMOXICILLIN-POT CLAVULANATE 875-125 MG PO TABS: 1.0000 | ORAL_TABLET | Freq: Two times a day (BID) | ORAL | Status: DC

## 2022-05-01 MED ORDER — PHENYLEPHRINE HCL-NACL 20-0.9 MG/250ML-% IV SOLN
INTRAVENOUS | Status: AC
  Filled 2022-05-01: qty 250

## 2022-05-01 MED ORDER — CEFAZOLIN SODIUM-DEXTROSE 2-4 GM/100ML-% IV SOLN
2.0000 g | INTRAVENOUS | Status: AC
  Administered 2022-05-01: 2 g via INTRAVENOUS
  Filled 2022-05-01: qty 100

## 2022-05-01 MED ORDER — BISACODYL 10 MG RE SUPP: 10.0000 mg | Freq: Every day | RECTAL | Status: DC | PRN

## 2022-05-01 MED ORDER — ONDANSETRON HCL 4 MG/2ML IJ SOLN
INTRAMUSCULAR | Status: AC
  Filled 2022-05-01: qty 2

## 2022-05-01 MED ORDER — ORAL CARE MOUTH RINSE: 15.0000 mL | Freq: Once | OROMUCOSAL | Status: AC

## 2022-05-01 MED ORDER — FENTANYL CITRATE PF 50 MCG/ML IJ SOSY: 25.0000 ug | PREFILLED_SYRINGE | INTRAMUSCULAR | Status: DC | PRN

## 2022-05-01 SURGICAL SUPPLY — 64 items
BIT DRILL 1.6MX128 (BIT) IMPLANT
BIT DRILL 170X2.5X (BIT) IMPLANT
BIT DRL 170X2.5X (BIT) ×1
BLADE SAG 18X100X1.27 (BLADE) ×1 IMPLANT
COVER BACK TABLE 60X90IN (DRAPES) ×1 IMPLANT
COVER SURGICAL LIGHT HANDLE (MISCELLANEOUS) ×1 IMPLANT
DRAPE INCISE IOBAN 66X45 STRL (DRAPES) ×1 IMPLANT
DRAPE ORTHO SPLIT 77X108 STRL (DRAPES) ×2
DRAPE SHEET LG 3/4 BI-LAMINATE (DRAPES) ×1 IMPLANT
DRAPE SURG ORHT 6 SPLT 77X108 (DRAPES) ×2 IMPLANT
DRAPE TOP 10253 STERILE (DRAPES) ×1 IMPLANT
DRAPE U-SHAPE 47X51 STRL (DRAPES) ×1 IMPLANT
DRILL 2.5 (BIT) ×1
DRSG ADAPTIC 3X8 NADH LF (GAUZE/BANDAGES/DRESSINGS) ×1 IMPLANT
DURAPREP 26ML APPLICATOR (WOUND CARE) ×1 IMPLANT
ECCENTRIC EPIPHYSI MODULAR SZ1 (Trauma) IMPLANT
ELECT BLADE TIP CTD 4 INCH (ELECTRODE) ×1 IMPLANT
ELECT NDL TIP 2.8 STRL (NEEDLE) ×1 IMPLANT
ELECT NEEDLE TIP 2.8 STRL (NEEDLE) ×1 IMPLANT
ELECT REM PT RETURN 15FT ADLT (MISCELLANEOUS) ×1 IMPLANT
FACESHIELD WRAPAROUND (MASK) ×1 IMPLANT
FACESHIELD WRAPAROUND OR TEAM (MASK) ×1 IMPLANT
GAUZE PAD ABD 8X10 STRL (GAUZE/BANDAGES/DRESSINGS) ×1 IMPLANT
GAUZE SPONGE 4X4 12PLY STRL (GAUZE/BANDAGES/DRESSINGS) ×1 IMPLANT
GLENOSPHERE DELTA XTEND LAT 38 (Miscellaneous) IMPLANT
GLOVE BIOGEL PI IND STRL 7.5 (GLOVE) ×1 IMPLANT
GLOVE BIOGEL PI IND STRL 8.5 (GLOVE) ×1 IMPLANT
GLOVE ORTHO TXT STRL SZ7.5 (GLOVE) ×1 IMPLANT
GLOVE SURG ORTHO 8.5 STRL (GLOVE) ×1 IMPLANT
GOWN STRL REUS W/ TWL XL LVL3 (GOWN DISPOSABLE) ×2 IMPLANT
GOWN STRL REUS W/TWL XL LVL3 (GOWN DISPOSABLE) ×2
KIT BASIN OR (CUSTOM PROCEDURE TRAY) ×1 IMPLANT
KIT TURNOVER KIT A (KITS) IMPLANT
MANIFOLD NEPTUNE II (INSTRUMENTS) ×1 IMPLANT
METAGLENE DELTA EXTEND (Trauma) IMPLANT
METAGLENE DXTEND (Trauma) ×1 IMPLANT
MODULAR ECCENTRIC EPIPHYSI SZ1 (Trauma) ×1 IMPLANT
NDL MAYO CATGUT SZ4 TPR NDL (NEEDLE) ×1 IMPLANT
NEEDLE MAYO CATGUT SZ4 (NEEDLE) ×1 IMPLANT
NS IRRIG 1000ML POUR BTL (IV SOLUTION) ×1 IMPLANT
PACK SHOULDER (CUSTOM PROCEDURE TRAY) ×1 IMPLANT
PIN GUIDE 1.2 (PIN) IMPLANT
PIN GUIDE GLENOPHERE 1.5MX300M (PIN) IMPLANT
PIN METAGLENE 2.5 (PIN) IMPLANT
PROTECTOR NERVE ULNAR (MISCELLANEOUS) ×1 IMPLANT
RESTRAINT HEAD UNIVERSAL NS (MISCELLANEOUS) ×1 IMPLANT
SCREW LOCK DELTA XTEND 4.5X30 (Screw) IMPLANT
SLING ARM FOAM STRAP LRG (SOFTGOODS) IMPLANT
SLING ARM FOAM STRAP MED (SOFTGOODS) IMPLANT
SPACER 38 PLUS 3 (Spacer) IMPLANT
SPONGE T-LAP 4X18 ~~LOC~~+RFID (SPONGE) ×1 IMPLANT
STEM DELTA DIA 10 HA (Stem) IMPLANT
STRIP CLOSURE SKIN 1/2X4 (GAUZE/BANDAGES/DRESSINGS) ×1 IMPLANT
SUCTION FRAZIER HANDLE 10FR (MISCELLANEOUS) ×1
SUCTION TUBE FRAZIER 10FR DISP (MISCELLANEOUS) ×1 IMPLANT
SUT FIBERWIRE #2 38 T-5 BLUE (SUTURE) ×2
SUT MNCRL AB 4-0 PS2 18 (SUTURE) ×1 IMPLANT
SUT VIC AB 0 CT1 36 (SUTURE) ×2 IMPLANT
SUT VIC AB 0 CT2 27 (SUTURE) ×1 IMPLANT
SUT VIC AB 2-0 CT1 27 (SUTURE) ×1
SUT VIC AB 2-0 CT1 TAPERPNT 27 (SUTURE) ×1 IMPLANT
SUTURE FIBERWR #2 38 T-5 BLUE (SUTURE) ×2 IMPLANT
TAPE CLOTH SURG 6X10 WHT LF (GAUZE/BANDAGES/DRESSINGS) IMPLANT
TOWEL OR 17X26 10 PK STRL BLUE (TOWEL DISPOSABLE) ×1 IMPLANT

## 2022-05-01 NOTE — Plan of Care (Signed)
  Problem: Coping: Goal: Level of anxiety will decrease Outcome: Progressing   Problem: Pain Managment: Goal: General experience of comfort will improve Outcome: Progressing   Problem: Safety: Goal: Ability to remain free from injury will improve Outcome: Progressing   

## 2022-05-01 NOTE — Transfer of Care (Signed)
Immediate Anesthesia Transfer of Care Note  Patient: NANAMI HAEGELE  Procedure(s) Performed: REVERSE SHOULDER ARTHROPLASTY (Right: Shoulder)  Patient Location: PACU  Anesthesia Type:GA combined with regional for post-op pain  Level of Consciousness: awake, alert , oriented, and patient cooperative  Airway & Oxygen Therapy: Patient Spontanous Breathing and Patient connected to face mask oxygen  Post-op Assessment: Report given to RN and Post -op Vital signs reviewed and stable  Post vital signs: Reviewed and stable  Last Vitals:  Vitals Value Taken Time  BP 181/90 05/01/22 1435  Temp    Pulse 91 05/01/22 1439  Resp 17 05/01/22 1439  SpO2 95 % 05/01/22 1439  Vitals shown include unvalidated device data.  Last Pain:  Vitals:   05/01/22 1038  TempSrc: Oral  PainSc: 0-No pain      Patients Stated Pain Goal: 4 (Q000111Q [id number])  Complications: No notable events documented.

## 2022-05-01 NOTE — Anesthesia Procedure Notes (Signed)
Anesthesia Procedure Image    

## 2022-05-01 NOTE — Anesthesia Procedure Notes (Signed)
Anesthesia Regional Block: Interscalene brachial plexus block   Pre-Anesthetic Checklist: , timeout performed,  Correct Patient, Correct Site, Correct Laterality,  Correct Procedure, Correct Position, site marked,  Risks and benefits discussed,  Surgical consent,  Pre-op evaluation,  At surgeon's request and post-op pain management  Laterality: Right  Prep: chloraprep       Needles:  Injection technique: Single-shot  Needle Type: Echogenic Needle     Needle Length: 9cm      Additional Needles:   Procedures:,,,, ultrasound used (permanent image in chart),,    Narrative:  Start time: 05/01/2022 11:36 AM End time: 05/01/2022 11:45 AM Injection made incrementally with aspirations every 5 mL.  Performed by: Personally  Anesthesiologist: Myrtie Soman, MD  Additional Notes: Patient tolerated the procedure well without complications

## 2022-05-01 NOTE — Op Note (Signed)
NAME: Linda Osborne, Linda Osborne MEDICAL RECORD NO: [id number] ACCOUNT NO: [id number] DATE OF BIRTH: 13-Apr-1955 FACILITY: [facility]ATION: [id number] PHYSICIAN: Doran Heater. Veverly Fells, MD  Operative Report   DATE OF PROCEDURE: 05/01/2022  PREOPERATIVE DIAGNOSIS:  Right shoulder end-stage arthritis.  POSTOPERATIVE DIAGNOSIS:  Right shoulder end-stage arthritis.  PROCEDURE PERFORMED:  Right reverse shoulder replacement using DePuy Delta Xtend prosthesis (no subscapularis repair).  ATTENDING SURGEON:  Doran Heater. Veverly Fells, MD  ASSISTANT:  Charletta Cousin [place], [place], who was scrubbed during the entire procedure.  ANESTHESIA:  General anesthesia was used plus interscalene block.  ESTIMATED BLOOD LOSS:  200 mL  FLUID REPLACEMENT:  1500 mL crystalloid.  COUNTS:  Instrument counts correct.  COMPLICATIONS:  No complications.  ANTIBIOTICS:  Perioperative antibiotics were given.  INDICATIONS:  The patient is a 67 year old female with worsening right shoulder pain secondary to severe arthritis.  The patient has a large effusion has had progressive pain despite conservative management.  She presents now for operative reverse  shoulder replacement to eliminate pain and restore function.  Informed consent obtained.  DESCRIPTION OF PROCEDURE:  After an adequate level of anesthesia was achieved, the patient was positioned in modified beach chair position.  Right shoulder correctly identified and sterilely prepped and draped in the usual manner.  Timeout called,  verifying correct patient, correct site, we entered the patient's shoulder using a standard deltopectoral approach, starting at the coracoid process extending down the anterior humerus.  Dissection down through subcutaneous tissues using Bovie  electrocautery.  Cephalic vein was identified and taken laterally with the deltoid, pectoralis taken medially.  Conjoined tendon identified and retracted medially.  We placed our deep retractors and I tenodesed the biceps in situ  with 0 Vicryl  figure-of-eight suture x2.  We then released the subscapularis, subperiosteally off the lesser tuberosity.  It was fairly contracted.  We tagged it for protection of the axillary nerve, we released the inferior capsule, progressively externally rotating  and delivering the humeral head out of the wound.  The entire humeral head was completely devoid of cartilage and there was erosive cystic changes in the superior aspect of the head.  We entered the proximal humerus with a 6 mm reamer, reaming up to a  size 10.  We then placed our 10 mm T-handle guide and resected the head at 20 degrees of retroversion with the oscillating saw. We removed excess osteophytes with a rongeur.  We then subluxed the humerus posteriorly, gaining good exposure of the glenoid  face.  We did a capsule labral excision including biceps anchor.  Once we had our deep retractors in place, protecting the axillary nerve we went ahead and found our center point for a guide pin and drilled that in bicortically.  We then reamed for the  metaglene baseplate down to subchondral bone.  We did our peripheral hand reaming with the T-handle reamer and then we drilled out our central peg hole.  We impacted the HA coated press-fit baseplate into position.  We then placed a 30 screw inferiorly  and a 30 screw superiorly with good purchase on both screws.  We locked both screws in place and secured, a 38+0 standard glenosphere onto the baseplate with a screwdriver.  Once that was in place, I did a finger sweep and make sure we had no soft tissue  caught up in that bearing.  We went back to the humeral side, reamed for the one right metaphysis.  We then trialled with a 10 stem and one right set  on the 0 setting and impacted in 20 degrees of retroversion.  I used a 38+3 poly trial, reduced the  shoulder. We were happy with the soft tissue balancing and stability.  We removed the trial components, irrigated thoroughly.  We used available  bone graft from the humeral head with impaction grafting technique and impacted the HA coated press fit 10  stem and the one right metaphysis set on the 0 setting and impacted in 20 degrees of retroversion.  We had really good stem security.  We selected the real 38+3 poly impacted on the humeral tray and reduced the shoulder, had nice little pop as it  reduced.  Appropriate conjoined tensioning, no gapping with inferior pole or external rotation.  Really good range of motion and resected the subscap remnant as it was extremely inflammatory and tight with the Bovie.  We went ahead and irrigated  thoroughly and then repaired deltopectoral interval with 0 Vicryl suture followed by 2-0 Vicryl for subcutaneous closure and 4-0 Monocryl for skin.  Steri-Strips applied followed by sterile dressing.  The patient tolerated surgery well.   PUS D: 05/01/2022 2:27:53 pm T: 05/01/2022 4:01:00 pm  JOB: [id number]

## 2022-05-01 NOTE — Interval H&P Note (Signed)
History and Physical Interval Note:  05/01/2022 11:54 AM  Linda Osborne  has presented today for surgery, with the diagnosis of right shoulder osteoarthritis.  The various methods of treatment have been discussed with the patient and family. After consideration of risks, benefits and other options for treatment, the patient has consented to  Procedure(s) with comments: REVERSE SHOULDER ARTHROPLASTY (Right) - general and choice with interscalene block as a surgical intervention.  The patient's history has been reviewed, patient examined, no change in status, stable for surgery.  I have reviewed the patient's chart and labs.  Questions were answered to the patient's satisfaction.     Augustin Schooling

## 2022-05-01 NOTE — Anesthesia Postprocedure Evaluation (Signed)
Anesthesia Post Note  Patient: Linda Osborne  Procedure(s) Performed: REVERSE SHOULDER ARTHROPLASTY (Right: Shoulder)     Patient location during evaluation: PACU Anesthesia Type: General Level of consciousness: awake and alert Pain management: pain level controlled Vital Signs Assessment: post-procedure vital signs reviewed and stable Respiratory status: spontaneous breathing, nonlabored ventilation, respiratory function stable and patient connected to nasal cannula oxygen Cardiovascular status: blood pressure returned to baseline and stable Postop Assessment: no apparent nausea or vomiting Anesthetic complications: no  No notable events documented.  Last Vitals:  Vitals:   05/01/22 1530 05/01/22 1550  BP: (!) 167/86 (!) 174/88  Pulse: 88 81  Resp: 15 14  Temp:    SpO2: 95% 95%    Last Pain:  Vitals:   05/01/22 1530  TempSrc:   PainSc: 7                  Arwilda Georgia S

## 2022-05-01 NOTE — Anesthesia Preprocedure Evaluation (Signed)
Anesthesia Evaluation  Patient identified by MRN, date of birth, ID band Patient awake    Reviewed: Allergy & Precautions, H&P , NPO status , Patient's Chart, lab work & pertinent test results  Airway Mallampati: II  TM Distance: >3 FB Neck ROM: Full    Dental no notable dental hx.    Pulmonary neg pulmonary ROS, former smoker   Pulmonary exam normal breath sounds clear to auscultation       Cardiovascular hypertension, Normal cardiovascular exam Rhythm:Regular Rate:Normal     Neuro/Psych negative neurological ROS  negative psych ROS   GI/Hepatic Neg liver ROS,GERD  ,,  Endo/Other  negative endocrine ROS    Renal/GU negative Renal ROS  negative genitourinary   Musculoskeletal negative musculoskeletal ROS (+)    Abdominal   Peds negative pediatric ROS (+)  Hematology negative hematology ROS (+)   Anesthesia Other Findings   Reproductive/Obstetrics negative OB ROS                             Anesthesia Physical Anesthesia Plan  ASA: 2  Anesthesia Plan: General   Post-op Pain Management: Regional block*   Induction: Intravenous  PONV Risk Score and Plan: 3 and Ondansetron, Dexamethasone and Treatment may vary due to age or medical condition  Airway Management Planned: Oral ETT  Additional Equipment:   Intra-op Plan:   Post-operative Plan: Extubation in OR  Informed Consent: I have reviewed the patients History and Physical, chart, labs and discussed the procedure including the risks, benefits and alternatives for the proposed anesthesia with the patient or authorized representative who has indicated his/her understanding and acceptance.     Dental advisory given  Plan Discussed with: CRNA and Surgeon  Anesthesia Plan Comments:        Anesthesia Quick Evaluation

## 2022-05-01 NOTE — Discharge Instructions (Addendum)
Ice to the shoulder constantly.  Keep the incision covered and clean and dry for one week, then ok to get it wet in the shower.  Do exercise as instructed several times per day.  DO NOT reach behind your back or push up out of a chair with the operative arm.  Use a sling while you are up and around for comfort, may remove while seated.  Keep pillow propped behind the operative elbow.  Follow up with Dr Veverly Fells in two weeks in the office, call [phone] for appt  Call Dr Veverly Fells at [phone] (cell) for any questions or concerns

## 2022-05-01 NOTE — TOC CM/SW Note (Signed)
Transition of Care [facility]) Screening Note  Patient Details  Name: Linda Osborne Date of Birth: [birth year]  Transition of [facility]) CM/SW Contact:    Sherie Don, LCSW Phone Number: 05/01/2022, 3:56 PM  Transition of Care Department [facility]) has reviewed patient and no TOC needs have been identified at this time. We will continue to monitor patient advancement through interdisciplinary progression rounds. If new patient transition needs arise, please place a TOC consult.

## 2022-05-01 NOTE — Anesthesia Procedure Notes (Signed)
Procedure Name: Intubation Date/Time: 05/01/2022 12:49 PM  Performed by: Randye Lobo, CRNAPre-anesthesia Checklist: Patient identified, Emergency Drugs available, Suction available and Patient being monitored Patient Re-evaluated:Patient Re-evaluated prior to induction Oxygen Delivery Method: Circle System Utilized Preoxygenation: Pre-oxygenation with 100% oxygen Induction Type: IV induction Ventilation: Mask ventilation without difficulty Laryngoscope Size: Mac and 3 Grade View: Grade II Tube type: Oral Tube size: 7.0 mm Number of attempts: 1 Airway Equipment and Method: Stylet and Oral airway Placement Confirmation: ETT inserted through vocal cords under direct vision, positive ETCO2 and breath sounds checked- equal and bilateral Secured at: 21 cm Tube secured with: Tape Dental Injury: Teeth and Oropharynx as per pre-operative assessment

## 2022-05-01 NOTE — Brief Op Note (Signed)
05/01/2022  2:23 PM  PATIENT:  Linda Osborne  67 y.o. female  PRE-OPERATIVE DIAGNOSIS:  right shoulder osteoarthritis, end stage  POST-OPERATIVE DIAGNOSIS:  right shoulder osteoarthritis, end stage  PROCEDURE:  Procedure(s) with comments: REVERSE SHOULDER ARTHROPLASTY (Right) - general and choice with interscalene block DePuy Delta Xtend  SURGEON:  Surgeon(s) and Role:    Netta Cedars, MD - Primary  PHYSICIAN ASSISTANT:   ASSISTANTS: Ventura Bruns, PA-C   ANESTHESIA:   regional and general  EBL:  100 mL   BLOOD ADMINISTERED:none  DRAINS: none   LOCAL MEDICATIONS USED:  MARCAINE     SPECIMEN:  No Specimen  DISPOSITION OF SPECIMEN:  N/A  COUNTS:  YES  TOURNIQUET:  * No tourniquets in log *  DICTATION: .Other Dictation: Dictation Number [id number]  PLAN OF CARE: Admit for overnight observation  PATIENT DISPOSITION:  PACU - hemodynamically stable.   Delay start of Pharmacological VTE agent (>24hrs) due to surgical blood loss or risk of bleeding: not applicable

## 2022-05-02 DIAGNOSIS — I10 Essential (primary) hypertension: Secondary | ICD-10-CM | POA: Diagnosis not present

## 2022-05-02 DIAGNOSIS — M19011 Primary osteoarthritis, right shoulder: Secondary | ICD-10-CM | POA: Diagnosis not present

## 2022-05-02 DIAGNOSIS — Z87891 Personal history of nicotine dependence: Secondary | ICD-10-CM | POA: Diagnosis not present

## 2022-05-02 DIAGNOSIS — Z79899 Other long term (current) drug therapy: Secondary | ICD-10-CM | POA: Diagnosis not present

## 2022-05-02 LAB — BASIC METABOLIC PANEL
Anion gap: 7 (ref 5–15)
BUN: 16 mg/dL (ref 8–23)
CO2: 23 mmol/L (ref 22–32)
Calcium: 9.8 mg/dL (ref 8.9–10.3)
Chloride: 108 mmol/L (ref 98–111)
Creatinine, Ser: 1.03 mg/dL — ABNORMAL HIGH (ref 0.44–1.00)
GFR, Estimated: >60 mL/min (ref >60)
Glucose, Bld: 122 mg/dL — ABNORMAL HIGH (ref 70–99)
Potassium: 4.5 mmol/L (ref 3.5–5.1)
Sodium: 138 mmol/L (ref 135–145)

## 2022-05-02 LAB — CBC
HCT: 32.8 % — ABNORMAL LOW (ref 36.0–46.0)
Hemoglobin: 10.7 g/dL — ABNORMAL LOW (ref 12.0–15.0)
MCH: 29.2 pg (ref 26.0–34.0)
MCHC: 32.6 g/dL (ref 30.0–36.0)
MCV: 89.6 fL (ref 80.0–100.0)
Platelets: 347 10*3/uL (ref 150–400)
RBC: 3.66 MIL/uL — ABNORMAL LOW (ref 3.87–5.11)
RDW: 12.7 % (ref 11.5–15.5)
WBC: 16.6 10*3/uL — ABNORMAL HIGH (ref 4.0–10.5)
nRBC: 0 % (ref 0.0–0.2)

## 2022-05-02 NOTE — Progress Notes (Signed)
Orthopedics Progress Note  Subjective: No pain this AM. Arm still weak  Objective:  Vitals:   05/02/22 0149 05/02/22 0604  BP: 128/79 137/77  Pulse: 88 88  Resp: 17 16  Temp: 98.6 F (37 C) 98.1 F (36.7 C)  SpO2: 94% 96%    General: Awake and alert  Musculoskeletal: Right shoulder dressing changed, wound looks good Neurovascularly intact  Lab Results  Component Value Date   WBC 16.6 (H) 05/02/2022   HGB 10.7 (L) 05/02/2022   HCT 32.8 (L) 05/02/2022   MCV 89.6 05/02/2022   PLT 347 05/02/2022       Component Value Date/Time   NA 138 05/02/2022 0351   K 4.5 05/02/2022 0351   CL 108 05/02/2022 0351   CO2 23 05/02/2022 0351   GLUCOSE 122 (H) 05/02/2022 0351   BUN 16 05/02/2022 0351   CREATININE 1.03 (H) 05/02/2022 0351   CALCIUM 9.8 05/02/2022 0351   GFRNONAA >60 05/02/2022 0351   GFRAA >60 08/21/2019 2100    No results found for: "INR", "PROTIME"  Assessment/Plan: POD #1 s/p Procedure(s): REVERSE SHOULDER ARTHROPLASTY Stable overnight. Plan discharge to home after therapy Follow up in two weeks in the office  Remo Lipps R. Veverly Fells, MD 05/02/2022 6:43 AM

## 2022-05-02 NOTE — Evaluation (Signed)
Occupational Therapy Evaluation Patient Details Name: Linda Osborne MRN: [id number] DOB: 1955/06/11 Today's Date: 05/02/2022   History of Present Illness Patient s/p REVERSE SHOULDER ARTHROPLASTY (Right)   Clinical Impression   Linda Osborne is a 67 year old woman who presents s/p shoulder replacement without functional use of right dominant upper extremity secondary to effects of surgery and interscalene block and shoulder precautions. Therapist provided education and instruction to patient in regards to exercises, precautions, positioning, donning upper extremity clothing and bathing while maintaining shoulder precautions, ice and edema management and donning/doffing sling. Patient verbalized understanding and handouts provided. Patient to follow up with MD for further therapy needs.        Recommendations for follow up therapy are one component of a multi-disciplinary discharge planning process, led by the attending physician.  Recommendations may be updated based on patient status, additional functional criteria and insurance authorization.   Follow Up Recommendations  No OT follow up     Assistance Recommended at Discharge PRN  Patient can return home with the following Assistance with cooking/housework    Functional Status Assessment  Patient has had a recent decline in their functional status and demonstrates the ability to make significant improvements in function in a reasonable and predictable amount of time.  Equipment Recommendations  None recommended by OT    Recommendations for Other Services       Precautions / Restrictions Precautions Precautions: Shoulder Type of Shoulder Precautions: Order states No active No PROM Shoulder Interventions: Shoulder sling/immobilizer;Off for dressing/bathing/exercises (DC instructions state patient can come out of sling while seated for comfort) Restrictions Weight Bearing Restrictions: Yes RUE Weight Bearing: Non weight  bearing      Mobility Bed Mobility Overal bed mobility: Independent                  Transfers Overall transfer level: Independent                        Balance Overall balance assessment: No apparent balance deficits (not formally assessed)                                         ADL either performed or assessed with clinical judgement   ADL                                         General ADL Comments: Overall min assist for pulling clothing up over right hip and donning shirt otherwise modified independent. INstructed on compensatoyr strategies.     Vision Patient Visual Report: No change from baseline       Perception     Praxis      Pertinent Vitals/Pain Pain Assessment Pain Assessment: No/denies pain     Hand Dominance     Extremity/Trunk Assessment Upper Extremity Assessment Upper Extremity Assessment: RUE deficits/detail RUE Deficits / Details: impaired sensation and motor control secondary to block   Lower Extremity Assessment Lower Extremity Assessment: Overall WFL for tasks assessed       Communication     Cognition Arousal/Alertness: Awake/alert Behavior During Therapy: WFL for tasks assessed/performed Overall Cognitive Status: Within Functional Limits for tasks assessed  General Comments       Exercises     Shoulder Instructions Shoulder Instructions Donning/doffing shirt without moving shoulder: Independent Method for sponge bathing under operated UE: Independent Donning/doffing sling/immobilizer: Independent Correct positioning of sling/immobilizer: Independent ROM for elbow, wrist and digits of operated UE: Independent Sling wearing schedule (on at all times/off for ADL's): Independent Proper positioning of operated UE when showering: Independent Dressing change: Independent Positioning of UE while sleeping: [place] expects to be discharged to:: Private residence Living Arrangements: Spouse/significant other                                      Prior Functioning/Environment                          OT Problem List: Decreased strength;Decreased range of motion;Impaired UE functional use;Pain      OT Treatment/Interventions:      OT Goals(Current goals can be found in the care plan section) Acute Rehab OT Goals OT Goal Formulation: All assessment and education complete, DC therapy  OT Frequency:      Co-evaluation              AM-PAC OT "6 Clicks" Daily Activity     Outcome Measure Help from another person eating meals?: None Help from another person taking care of personal grooming?: None Help from another person toileting, which includes using toliet, bedpan, or urinal?: None Help from another person bathing (including washing, rinsing, drying)?: None Help from another person to put on and taking off regular upper body clothing?: A Little Help from another person to put on and taking off regular lower body clothing?: A Little 6 Click Score: 22   End of Session Nurse Communication:  (OT education complete)  Activity Tolerance: Patient tolerated treatment well Patient left: in bed;with call bell/phone within reach  OT Visit Diagnosis: Pain                Time: [id number] OT Time Calculation (min): 20 min Charges:  OT General Charges $OT Visit: 1 Visit OT Evaluation $OT Eval Low Complexity: 1 Low  Gustavo Lah, OTR/L Acute Care Rehab Services  Office [phone]   Lenward Chancellor 05/02/2022, 10:20 AM

## 2022-05-02 NOTE — Discharge Summary (Signed)
In most cases prophylactic antibiotics for Dental procdeures after total joint surgery are not necessary.  Exceptions are as follows:  1. History of prior total joint infection  2. Severely immunocompromised (Organ Transplant, cancer chemotherapy, Rheumatoid biologic meds such as [place])  3. Poorly controlled diabetes (A1C &gt; 8.0, blood glucose over 200)  If you have one of these conditions, contact your surgeon for an antibiotic prescription, prior to your dental procedure. Orthopedic Discharge Summary        Physician Discharge Summary  Patient ID: Linda Osborne MRN: [id number] DOB/AGE: Apr 18, 1955 67 y.o.  Admit date: 05/01/2022 Discharge date: 05/02/2022   Procedures:  Procedure(s) (LRB): REVERSE SHOULDER ARTHROPLASTY (Right)  Attending Physician:  Dr. Esmond Plants  Admission Diagnoses:   Right shoulder OA  Discharge Diagnoses:  same   Past Medical History:  Diagnosis Date   Anxiety    Arthritis    Depression    GERD (gastroesophageal reflux disease)    Headache    migraines   Hypertension     PCP: Charlane Ferretti, MD   Discharged Condition: good  Hospital Course:  Patient underwent the above stated procedure on 05/01/2022. Patient tolerated the procedure well and brought to the recovery room in good condition and subsequently to the floor. Patient had an uncomplicated hospital course and was stable for discharge.   Disposition: Discharge disposition: 01-Home or Self Care      with follow up in 2 weeks    Follow-up Information     Netta Cedars, MD. Call in 2 week(s).   Specialty: Orthopedic Surgery Why: call [phone] for appt Contact information: [address] [place] [address] [id number]                 Dental Antibiotics:  In most cases prophylactic antibiotics for Dental procdeures after total joint surgery are not necessary.  Exceptions are as follows:  1. History of prior total joint  infection  2. Severely immunocompromised (Organ Transplant, cancer chemotherapy, Rheumatoid biologic meds such as [place])  3. Poorly controlled diabetes (A1C &gt; 8.0, blood glucose over 200)  If you have one of these conditions, contact your surgeon for an antibiotic prescription, prior to your dental procedure.  Discharge Instructions     Call MD / Call 911   Complete by: As directed    If you experience chest pain or shortness of breath, CALL 911 and be transported to the hospital emergency room.  If you develope a fever above 101 F, pus (white drainage) or increased drainage or redness at the wound, or calf pain, call your surgeon's office.   Call MD / Call 911   Complete by: As directed    If you experience chest pain or shortness of breath, CALL 911 and be transported to the hospital emergency room.  If you develope a fever above 101 F, pus (white drainage) or increased drainage or redness at the wound, or calf pain, call your surgeon's office.   Constipation Prevention   Complete by: As directed    Drink plenty of fluids.  Prune juice may be helpful.  You may use a stool softener, such as Colace (over the counter) 100 mg twice a day.  Use MiraLax (over the counter) for constipation as needed.   Constipation Prevention   Complete by: As directed    Drink plenty of fluids.  Prune juice may be helpful.  You may use a stool softener, such as Colace (over the counter) 100  mg twice a day.  Use MiraLax (over the counter) for constipation as needed.   Diet - low sodium heart healthy   Complete by: As directed    Diet - low sodium heart healthy   Complete by: As directed    Increase activity slowly as tolerated   Complete by: As directed    Increase activity slowly as tolerated   Complete by: As directed    Post-operative opioid taper instructions:   Complete by: As directed    POST-OPERATIVE OPIOID TAPER INSTRUCTIONS: It is important to wean off of your opioid medication as soon as  possible. If you do not need pain medication after your surgery it is ok to stop day one. Opioids include: Codeine, Hydrocodone(Norco, Vicodin), Oxycodone(Percocet, oxycontin) and hydromorphone amongst others.  Long term and even short term use of opiods can cause: Increased pain response Dependence Constipation Depression Respiratory depression And more.  Withdrawal symptoms can include Flu like symptoms Nausea, vomiting And more Techniques to manage these symptoms Hydrate well Eat regular healthy meals Stay active Use relaxation techniques(deep breathing, meditating, yoga) Do Not substitute Alcohol to help with tapering If you have been on opioids for less than two weeks and do not have pain than it is ok to stop all together.  Plan to wean off of opioids This plan should start within one week post op of your joint replacement. Maintain the same interval or time between taking each dose and first decrease the dose.  Cut the total daily intake of opioids by one tablet each day Next start to increase the time between doses. The last dose that should be eliminated is the evening dose.      Post-operative opioid taper instructions:   Complete by: As directed    POST-OPERATIVE OPIOID TAPER INSTRUCTIONS: It is important to wean off of your opioid medication as soon as possible. If you do not need pain medication after your surgery it is ok to stop day one. Opioids include: Codeine, Hydrocodone(Norco, Vicodin), Oxycodone(Percocet, oxycontin) and hydromorphone amongst others.  Long term and even short term use of opiods can cause: Increased pain response Dependence Constipation Depression Respiratory depression And more.  Withdrawal symptoms can include Flu like symptoms Nausea, vomiting And more Techniques to manage these symptoms Hydrate well Eat regular healthy meals Stay active Use relaxation techniques(deep breathing, meditating, yoga) Do Not substitute Alcohol to  help with tapering If you have been on opioids for less than two weeks and do not have pain than it is ok to stop all together.  Plan to wean off of opioids This plan should start within one week post op of your joint replacement. Maintain the same interval or time between taking each dose and first decrease the dose.  Cut the total daily intake of opioids by one tablet each day Next start to increase the time between doses. The last dose that should be eliminated is the evening dose.          Allergies as of 05/02/2022   No Known Allergies      Medication List     STOP taking these medications    traZODone 50 MG tablet Commonly known as: DESYREL       TAKE these medications    acetaminophen 325 MG tablet Commonly known as: TYLENOL Take 650 mg by mouth every 6 (six) hours as needed for moderate pain.   amoxicillin-clavulanate 875-125 MG tablet Commonly known as: AUGMENTIN Take 1 tablet by mouth every 12 (twelve) hours.  amphetamine-dextroamphetamine 20 MG tablet Commonly known as: ADDERALL Take 20 mg by mouth in the morning, at noon, and at bedtime.   famotidine 20 MG tablet Commonly known as: PEPCID Take 20 mg by mouth daily as needed for heartburn or indigestion.   FISH OIL PO Take 1 capsule by mouth daily.   FLUoxetine 20 MG capsule Commonly known as: PROZAC Take 60 mg by mouth daily.   methocarbamol 500 MG tablet Commonly known as: ROBAXIN Take 1 tablet (500 mg total) by mouth every 8 (eight) hours as needed for muscle spasms.   multivitamin with minerals Tabs tablet Take 1 tablet by mouth daily.   ondansetron 4 MG tablet Commonly known as: Zofran Take 1 tablet (4 mg total) by mouth every 8 (eight) hours as needed for nausea, vomiting or refractory nausea / vomiting.   Oxycodone HCl 10 MG Tabs Take 1 tablet (10 mg total) by mouth 5 (five) times daily as needed (pain).   VITAMIN D3 PO Take 1 tablet by mouth daily.           Signed: Augustin Schooling 05/02/2022, 6:45 AM  [facility] is now [facility]  [place] [address] [place], [place], [place] [address] Phone: [place]

## 2022-05-02 NOTE — Progress Notes (Signed)
The patient is alert and oriented and has been seen by her physician. The orders for discharge were written. IV has been removed. Aquacel dressing is clean, dry, and intact. Went over discharge instructions with patient. She is being discharged via wheelchair with all of her belongings.

## 2022-05-02 NOTE — Plan of Care (Signed)
  Problem: Education: Goal: Knowledge of General Education information will improve Description Including pain rating scale, medication(s)/side effects and non-pharmacologic comfort measures Outcome: Progressing   

## 2022-05-04 ENCOUNTER — Encounter (HOSPITAL_COMMUNITY): Payer: Self-pay | Admitting: Orthopedic Surgery

## 2022-05-14 DIAGNOSIS — Z4789 Encounter for other orthopedic aftercare: Secondary | ICD-10-CM | POA: Diagnosis not present

## 2022-06-18 DIAGNOSIS — Z4789 Encounter for other orthopedic aftercare: Secondary | ICD-10-CM | POA: Diagnosis not present

## 2022-07-09 DIAGNOSIS — M25511 Pain in right shoulder: Secondary | ICD-10-CM | POA: Diagnosis not present

## 2022-07-20 DIAGNOSIS — G894 Chronic pain syndrome: Secondary | ICD-10-CM | POA: Diagnosis not present

## 2022-07-20 DIAGNOSIS — Z79891 Long term (current) use of opiate analgesic: Secondary | ICD-10-CM | POA: Diagnosis not present

## 2022-07-28 DIAGNOSIS — Z4789 Encounter for other orthopedic aftercare: Secondary | ICD-10-CM | POA: Diagnosis not present

## 2022-08-06 ENCOUNTER — Ambulatory Visit
Admission: RE | Admit: 2022-08-06 | Discharge: 2022-08-06 | Disposition: A | Discharge status: Home / Self Care | Payer: Medicare Other | Source: Ambulatory Visit | Attending: Internal Medicine | Admitting: Internal Medicine

## 2022-08-06 DIAGNOSIS — M8589 Other specified disorders of bone density and structure, multiple sites: Secondary | ICD-10-CM

## 2022-08-06 DIAGNOSIS — M8588 Other specified disorders of bone density and structure, other site: Secondary | ICD-10-CM | POA: Diagnosis not present

## 2022-09-09 ENCOUNTER — Other Ambulatory Visit: Payer: Self-pay | Admitting: Internal Medicine

## 2022-09-09 DIAGNOSIS — Z79899 Other long term (current) drug therapy: Secondary | ICD-10-CM | POA: Diagnosis not present

## 2022-09-09 DIAGNOSIS — E7800 Pure hypercholesterolemia, unspecified: Secondary | ICD-10-CM | POA: Diagnosis not present

## 2022-09-09 DIAGNOSIS — R748 Abnormal levels of other serum enzymes: Secondary | ICD-10-CM | POA: Diagnosis not present

## 2022-09-09 DIAGNOSIS — R918 Other nonspecific abnormal finding of lung field: Secondary | ICD-10-CM

## 2022-09-09 DIAGNOSIS — Z0000 Encounter for general adult medical examination without abnormal findings: Secondary | ICD-10-CM | POA: Diagnosis not present

## 2022-09-09 DIAGNOSIS — Z23 Encounter for immunization: Secondary | ICD-10-CM | POA: Diagnosis not present

## 2022-09-09 DIAGNOSIS — I700 Atherosclerosis of aorta: Secondary | ICD-10-CM | POA: Diagnosis not present

## 2022-09-09 DIAGNOSIS — M8589 Other specified disorders of bone density and structure, multiple sites: Secondary | ICD-10-CM | POA: Diagnosis not present

## 2022-09-09 DIAGNOSIS — J439 Emphysema, unspecified: Secondary | ICD-10-CM | POA: Diagnosis not present

## 2022-09-09 DIAGNOSIS — E559 Vitamin D deficiency, unspecified: Secondary | ICD-10-CM | POA: Diagnosis not present

## 2022-09-09 DIAGNOSIS — I10 Essential (primary) hypertension: Secondary | ICD-10-CM | POA: Diagnosis not present

## 2022-10-05 ENCOUNTER — Ambulatory Visit
Admission: RE | Admit: 2022-10-05 | Discharge: 2022-10-05 | Disposition: A | Discharge status: Home / Self Care | Payer: Medicare Other | Source: Ambulatory Visit | Attending: Internal Medicine | Admitting: Internal Medicine

## 2022-10-05 DIAGNOSIS — R918 Other nonspecific abnormal finding of lung field: Secondary | ICD-10-CM | POA: Diagnosis not present

## 2022-10-06 ENCOUNTER — Other Ambulatory Visit: Payer: Self-pay

## 2022-10-06 ENCOUNTER — Ambulatory Visit
Admission: EM | Admit: 2022-10-06 | Discharge: 2022-10-06 | Disposition: A | Discharge status: Home / Self Care | Payer: Medicare Other | Attending: Physician Assistant | Admitting: Physician Assistant

## 2022-10-06 DIAGNOSIS — L309 Dermatitis, unspecified: Secondary | ICD-10-CM | POA: Diagnosis not present

## 2022-10-06 MED ORDER — PREDNISONE 20 MG PO TABS: 20.0000 mg | ORAL_TABLET | Freq: Every day | ORAL | 0 refills | Status: AC

## 2022-10-06 NOTE — ED Provider Notes (Signed)
[name]    CSN: [id number] Arrival date & time: 10/06/22  1112      History   Chief Complaint Chief Complaint  Patient presents with   Rash    HPI Linda Osborne is a 67 y.o. female.   Patient here today for evaluation of dry skin to her face.  She states that she use mineral oil that was recommended by her PCP but has developed a rash to her forehead and her ear.  Rash is itchy but does burn at times.  She denies any other symptoms.  She has not any shortness of breath or trouble swallowing.  She has not had fever.  She is unsure of any other new products that might have caused rash.  The history is provided by the patient.  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      Home Medications    Prior to Admission medications   Medication Sig Start Date End Date Taking? Authorizing Provider  amphetamine-dextroamphetamine (ADDERALL) 20 MG tablet Take 20 mg by mouth in the morning, at noon, and at bedtime. 07/21/16  Yes [provider]  Cholecalciferol (VITAMIN D3 PO) Take 1 tablet by mouth daily.   Yes [provider]  FLUoxetine (PROZAC) 20 MG capsule Take 60 mg by mouth daily.  08/23/19  Yes [provider]  Multiple Vitamin (MULTIVITAMIN WITH MINERALS) TABS tablet Take 1 tablet by mouth daily.   Yes [provider]  Omega-3 Fatty Acids (FISH OIL PO) Take 1 capsule by mouth daily.   Yes [provider]  predniSONE (DELTASONE) 20 MG tablet Take 1 tablet (20 mg total) by mouth daily with breakfast for 5 days. 10/06/22 10/11/22 Yes Tomi Bamberger, PA-C  acetaminophen (TYLENOL) 325 MG tablet Take 650 mg by mouth every 6 (six) hours as needed for moderate pain.    [provider]  amoxicillin-clavulanate (AUGMENTIN) 875-125 MG tablet Take 1 tablet by mouth every 12 (twelve) hours. Patient not taking: Reported on 04/14/2022 11/09/21   Tomi Bamberger, PA-C  famotidine (PEPCID) 20 MG tablet Take 20 mg by mouth daily as needed for heartburn or indigestion.    [provider]  methocarbamol (ROBAXIN) 500 MG tablet Take 1 tablet (500 mg total) by mouth every 8 (eight) hours as needed for muscle spasms. 05/01/22   Beverely Low, MD  ondansetron (ZOFRAN) 4 MG tablet Take 1 tablet (4 mg total) by mouth every 8 (eight) hours as needed for nausea, vomiting or refractory nausea / vomiting. 05/01/22 05/01/23  Beverely Low, MD  Oxycodone HCl 10 MG TABS Take 1 tablet (10 mg  total) by mouth 5 (five) times daily as needed (pain). 05/01/22   Beverely Low, MD    Family History Family History  Problem Relation Age of Onset   Breast cancer Neg Hx     Social History Social History   Tobacco Use   Smoking status: Former    Current packs/day: 0.00    Average packs/day: 0.5 packs/day for 20.0 years (10.0 ttl pk-yrs)    Types: Cigarettes    Start date: 08/13/1990    Quit date: 08/13/2010    Years since quitting: 12.1   Smokeless tobacco: Never  Vaping Use   Vaping status: Never Used  Substance Use Topics   Alcohol use: Not Currently   Drug use: Not Currently     Allergies   Patient has no known allergies.   Review of Systems Review of Systems   Constitutional:  Negative for chills and fever.  Eyes:  Negative for discharge and redness.  Respiratory:  Negative for shortness of breath.   Gastrointestinal:  Negative for nausea and vomiting.  Skin:  Positive for rash.     Physical Exam Triage Vital Signs ED Triage Vitals  Encounter Vitals Group     BP 10/06/22 1134 (!) 159/86     Systolic BP Percentile --      Diastolic BP Percentile --      Pulse Rate 10/06/22 1134 78     Resp 10/06/22 1134 18     Temp 10/06/22 1134 98 F (36.7 C)     Temp Source 10/06/22 1134 Oral     SpO2 10/06/22 1134 98 %     Weight 10/06/22 1132 120 lb (54.4 kg)     Height 10/06/22 1132 4\' 11"  (1.499 m)     Head Circumference --      Peak Flow --      Pain Score 10/06/22 1131 0     Pain Loc --      Pain Education --      Exclude from Growth Chart --    No data found.  Updated Vital Signs BP (!) 152/79 (BP Location: Left Arm)   Pulse 78   Temp 98 F (36.7 C) (Oral)   Resp 18   Ht 4\' 11"  (1.499 m)   Wt 120 lb (54.4 kg)   SpO2 98%   BMI 24.24 kg/m      Physical Exam Vitals and nursing note reviewed.  Constitutional:      General: She is not in acute distress.    Appearance: Normal appearance. She is not ill-appearing.  HENT:     Head: Normocephalic and atraumatic.  Eyes:     Conjunctiva/sclera: Conjunctivae normal.  Cardiovascular:     Rate and Rhythm: Normal rate.  Pulmonary:     Effort: Pulmonary effort is normal. No respiratory distress.  Skin:    Comments: Dry skin noted to bilateral eyelids, mild erythematous maculopapular rash to forehead/ left ear, crosses midline.   Neurological:     Mental Status: She is alert.  Psychiatric:        Mood and Affect: Mood normal.        Behavior: Behavior normal.        Thought Content: Thought content normal.      UC Treatments / Results  Labs (all labs ordered are listed, but only abnormal results are displayed) Labs Reviewed - No data to display  EKG   Radiology No  results found.  Procedures Procedures (including critical care time)  Medications Ordered  in UC Medications - No data to display  Initial Impression / Assessment and Plan / UC Course  I have reviewed the triage vital signs and the nursing notes.  Pertinent labs & imaging results that were available during my care of the patient were reviewed by me and considered in my medical decision making (see chart for details).    Unclear etiology of rash but suspect a contact dermatitis. Will treat with steroid burst and recommended follow up if no gradual improvement or with any further concerns.   Final Clinical Impressions(s) / UC Diagnoses   Final diagnoses:  Dermatitis     Discharge Instructions      Please follow up if no gradual improvement or with any further concerns.      ED Prescriptions     Medication Sig Dispense Auth. Provider   predniSONE (DELTASONE) 20 MG tablet Take 1 tablet (20 mg total) by mouth daily with breakfast for 5 days. 5 tablet Tomi Bamberger, PA-C      PDMP not reviewed this encounter.   Tomi Bamberger, PA-C 10/06/22 [id number]

## 2022-10-06 NOTE — ED Triage Notes (Signed)
"  Started with very dry skin on my face, pcp recommended mineral oil recently and this didn't help, now I have rash, with itchy areas and hurts sometimes".

## 2022-10-06 NOTE — Discharge Instructions (Signed)
Please follow up if no gradual improvement or with any further concerns.

## 2022-11-23 DIAGNOSIS — M5136 Other intervertebral disc degeneration, lumbar region: Secondary | ICD-10-CM | POA: Diagnosis not present

## 2022-11-23 DIAGNOSIS — M19011 Primary osteoarthritis, right shoulder: Secondary | ICD-10-CM | POA: Diagnosis not present

## 2022-11-23 DIAGNOSIS — Z79891 Long term (current) use of opiate analgesic: Secondary | ICD-10-CM | POA: Diagnosis not present

## 2022-11-23 DIAGNOSIS — G894 Chronic pain syndrome: Secondary | ICD-10-CM | POA: Diagnosis not present

## 2022-11-23 DIAGNOSIS — M7541 Impingement syndrome of right shoulder: Secondary | ICD-10-CM | POA: Diagnosis not present

## 2023-03-25 DIAGNOSIS — Z79891 Long term (current) use of opiate analgesic: Secondary | ICD-10-CM | POA: Diagnosis not present

## 2023-03-25 DIAGNOSIS — M7541 Impingement syndrome of right shoulder: Secondary | ICD-10-CM | POA: Diagnosis not present

## 2023-03-25 DIAGNOSIS — M19011 Primary osteoarthritis, right shoulder: Secondary | ICD-10-CM | POA: Diagnosis not present

## 2023-03-25 DIAGNOSIS — Z79899 Other long term (current) drug therapy: Secondary | ICD-10-CM | POA: Diagnosis not present

## 2023-03-25 DIAGNOSIS — G894 Chronic pain syndrome: Secondary | ICD-10-CM | POA: Diagnosis not present

## 2023-03-25 DIAGNOSIS — Z5181 Encounter for therapeutic drug level monitoring: Secondary | ICD-10-CM | POA: Diagnosis not present

## 2023-03-25 DIAGNOSIS — M51362 Other intervertebral disc degeneration, lumbar region with discogenic back pain and lower extremity pain: Secondary | ICD-10-CM | POA: Diagnosis not present

## 2023-04-13 ENCOUNTER — Other Ambulatory Visit: Payer: Self-pay | Admitting: Internal Medicine

## 2023-04-13 DIAGNOSIS — R918 Other nonspecific abnormal finding of lung field: Secondary | ICD-10-CM

## 2023-05-17 ENCOUNTER — Ambulatory Visit
Admission: RE | Admit: 2023-05-17 | Discharge: 2023-05-17 | Disposition: A | Discharge status: Home / Self Care | Payer: Medicare Other | Source: Ambulatory Visit | Attending: Internal Medicine | Admitting: Internal Medicine

## 2023-05-17 DIAGNOSIS — R918 Other nonspecific abnormal finding of lung field: Secondary | ICD-10-CM

## 2023-05-17 DIAGNOSIS — J849 Interstitial pulmonary disease, unspecified: Secondary | ICD-10-CM | POA: Diagnosis not present

## 2023-07-26 DIAGNOSIS — M19011 Primary osteoarthritis, right shoulder: Secondary | ICD-10-CM | POA: Diagnosis not present

## 2023-07-26 DIAGNOSIS — Z79891 Long term (current) use of opiate analgesic: Secondary | ICD-10-CM | POA: Diagnosis not present

## 2023-07-26 DIAGNOSIS — Z79899 Other long term (current) drug therapy: Secondary | ICD-10-CM | POA: Diagnosis not present

## 2023-07-26 DIAGNOSIS — M51362 Other intervertebral disc degeneration, lumbar region with discogenic back pain and lower extremity pain: Secondary | ICD-10-CM | POA: Diagnosis not present

## 2023-07-26 DIAGNOSIS — M7541 Impingement syndrome of right shoulder: Secondary | ICD-10-CM | POA: Diagnosis not present

## 2023-07-26 DIAGNOSIS — G894 Chronic pain syndrome: Secondary | ICD-10-CM | POA: Diagnosis not present

## 2023-10-29 DIAGNOSIS — Z79899 Other long term (current) drug therapy: Secondary | ICD-10-CM | POA: Diagnosis not present

## 2023-10-29 DIAGNOSIS — M8589 Other specified disorders of bone density and structure, multiple sites: Secondary | ICD-10-CM | POA: Diagnosis not present

## 2023-10-29 DIAGNOSIS — E7800 Pure hypercholesterolemia, unspecified: Secondary | ICD-10-CM | POA: Diagnosis not present

## 2023-10-29 DIAGNOSIS — H60543 Acute eczematoid otitis externa, bilateral: Secondary | ICD-10-CM | POA: Diagnosis not present

## 2023-10-29 DIAGNOSIS — Z0000 Encounter for general adult medical examination without abnormal findings: Secondary | ICD-10-CM | POA: Diagnosis not present

## 2023-10-29 DIAGNOSIS — G8929 Other chronic pain: Secondary | ICD-10-CM | POA: Diagnosis not present

## 2023-12-08 DIAGNOSIS — M25561 Pain in right knee: Secondary | ICD-10-CM | POA: Diagnosis not present

## 2023-12-08 DIAGNOSIS — Z5181 Encounter for therapeutic drug level monitoring: Secondary | ICD-10-CM | POA: Diagnosis not present

## 2023-12-08 DIAGNOSIS — M25562 Pain in left knee: Secondary | ICD-10-CM | POA: Diagnosis not present

## 2024-01-26 NOTE — Progress Notes (Signed)
 Linda Osborne                                          MRN: [id number]   01/26/2024   The [facility] Quality Team Specialist reviewed this patient medical record for the purposes of chart review for care gap closure. The following were reviewed: chart review for care gap closure-breast cancer screening.    [facility] Quality Team
